# Patient Record
Sex: Female | Born: 1940
Health system: Southern US, Community
[De-identification: ages and names within clinical notes are randomized; demographics above are authoritative.]

## PROBLEM LIST (undated history)

## (undated) DIAGNOSIS — F419 Anxiety disorder, unspecified: Secondary | ICD-10-CM

## (undated) DIAGNOSIS — IMO0002 Reserved for concepts with insufficient information to code with codable children: Secondary | ICD-10-CM

## (undated) DIAGNOSIS — C801 Malignant (primary) neoplasm, unspecified: Secondary | ICD-10-CM

## (undated) DIAGNOSIS — K6389 Other specified diseases of intestine: Secondary | ICD-10-CM

## (undated) DIAGNOSIS — E663 Overweight: Secondary | ICD-10-CM

## (undated) DIAGNOSIS — E785 Hyperlipidemia, unspecified: Secondary | ICD-10-CM

## (undated) DIAGNOSIS — I10 Essential (primary) hypertension: Secondary | ICD-10-CM

## (undated) HISTORY — PX: COLECTOMY: SHX59

## (undated) HISTORY — DX: Malignant (primary) neoplasm, unspecified: C80.1

## (undated) HISTORY — DX: Overweight: E66.3

## (undated) HISTORY — DX: Reserved for concepts with insufficient information to code with codable children: IMO0002

## (undated) HISTORY — DX: Other specified diseases of intestine: K63.89

## (undated) HISTORY — DX: Essential (primary) hypertension: I10

---

## 1998-05-26 ENCOUNTER — Other Ambulatory Visit: Admission: RE | Admit: 1998-05-26 | Discharge: 1998-05-26 | Payer: Self-pay | Admitting: Obstetrics and Gynecology

## 1999-08-10 ENCOUNTER — Other Ambulatory Visit: Admission: RE | Admit: 1999-08-10 | Discharge: 1999-08-10 | Payer: Self-pay | Admitting: Obstetrics and Gynecology

## 2000-08-28 ENCOUNTER — Other Ambulatory Visit: Admission: RE | Admit: 2000-08-28 | Discharge: 2000-08-28 | Payer: Self-pay | Admitting: Obstetrics and Gynecology

## 2001-09-11 ENCOUNTER — Other Ambulatory Visit: Admission: RE | Admit: 2001-09-11 | Discharge: 2001-09-11 | Payer: Self-pay | Admitting: Obstetrics and Gynecology

## 2006-03-09 ENCOUNTER — Other Ambulatory Visit: Admission: RE | Admit: 2006-03-09 | Discharge: 2006-03-09 | Payer: Self-pay | Admitting: Family Medicine

## 2007-03-22 ENCOUNTER — Inpatient Hospital Stay (HOSPITAL_COMMUNITY): Admission: RE | Admit: 2007-03-22 | Discharge: 2007-03-27 | Payer: Self-pay | Admitting: General Surgery

## 2007-03-22 ENCOUNTER — Encounter (HOSPITAL_BASED_OUTPATIENT_CLINIC_OR_DEPARTMENT_OTHER): Payer: Self-pay | Admitting: General Surgery

## 2008-01-22 IMAGING — CR DG CHEST 2V
2 series · 2 of 2 positions shown · non-contrast
Comparison: none

CLINICAL DATA: Colon carcinoma.  Preop respiratory exam.  
 CHEST ? 2 VIEW:

[view not recorded (1 of 2)]
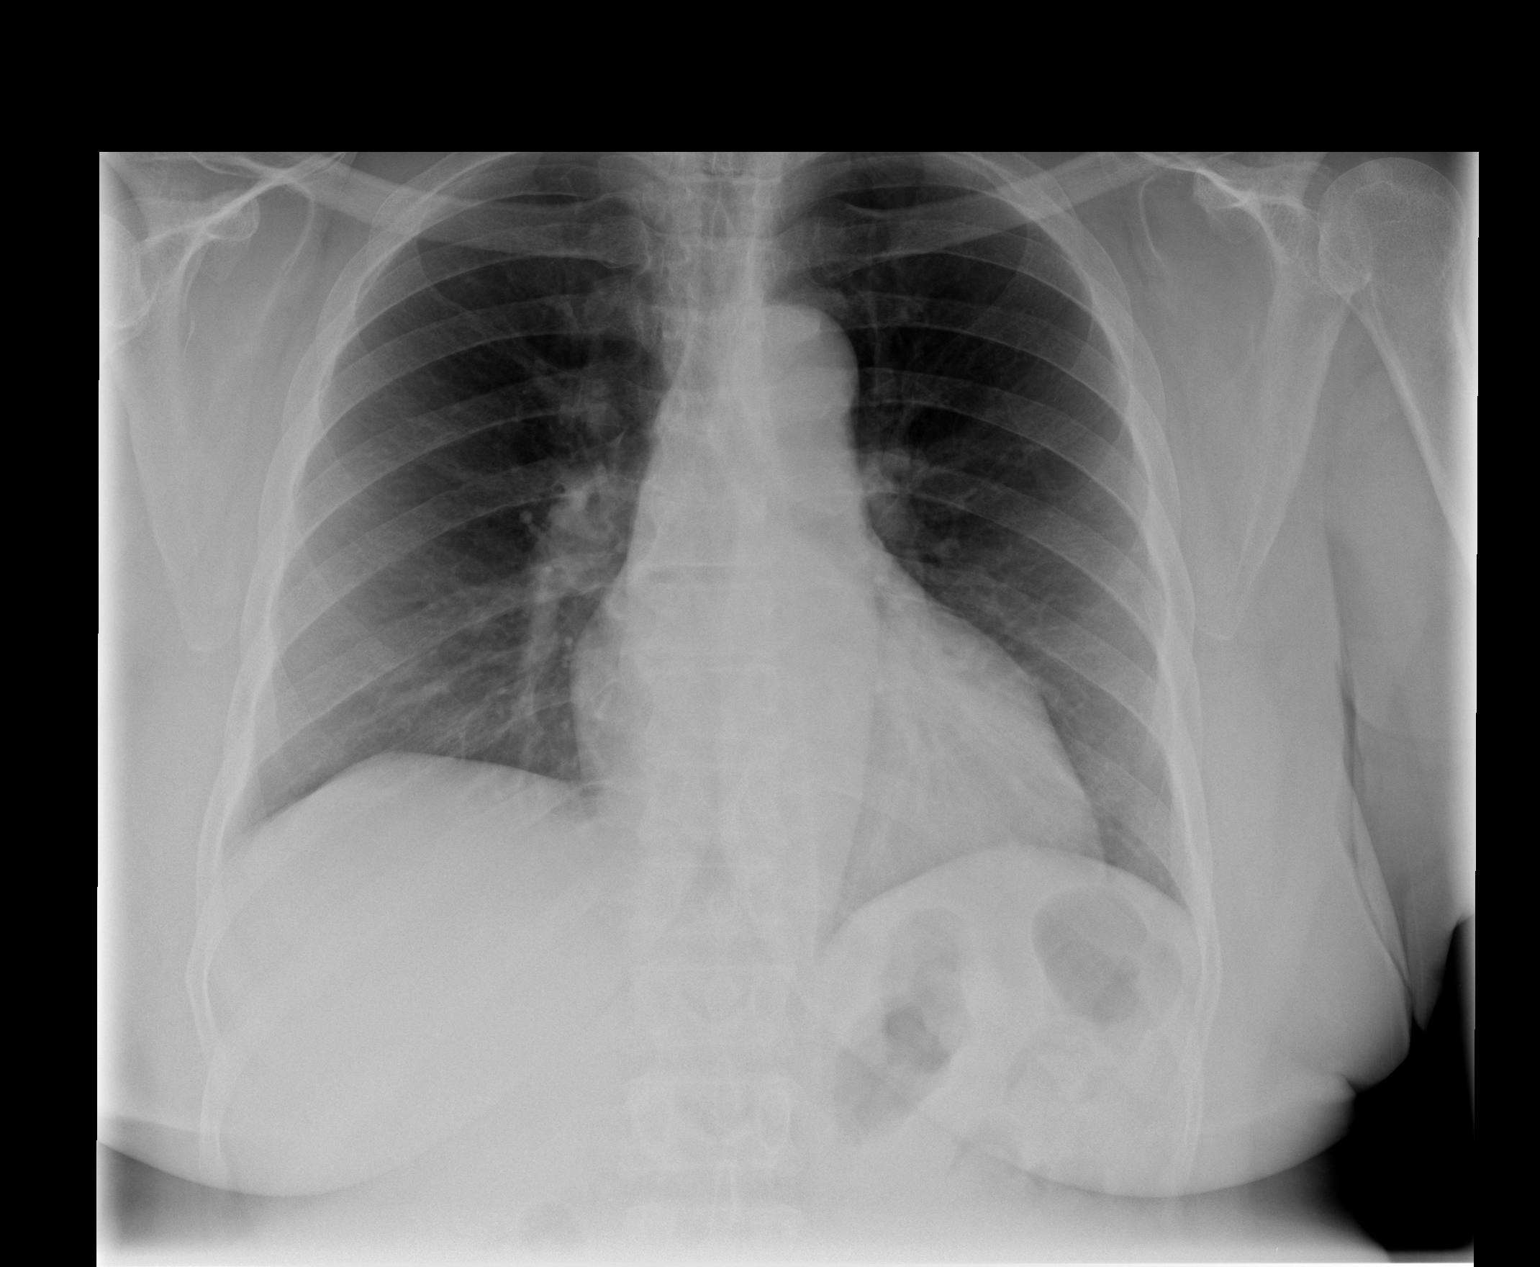

[view not recorded (2 of 2)]
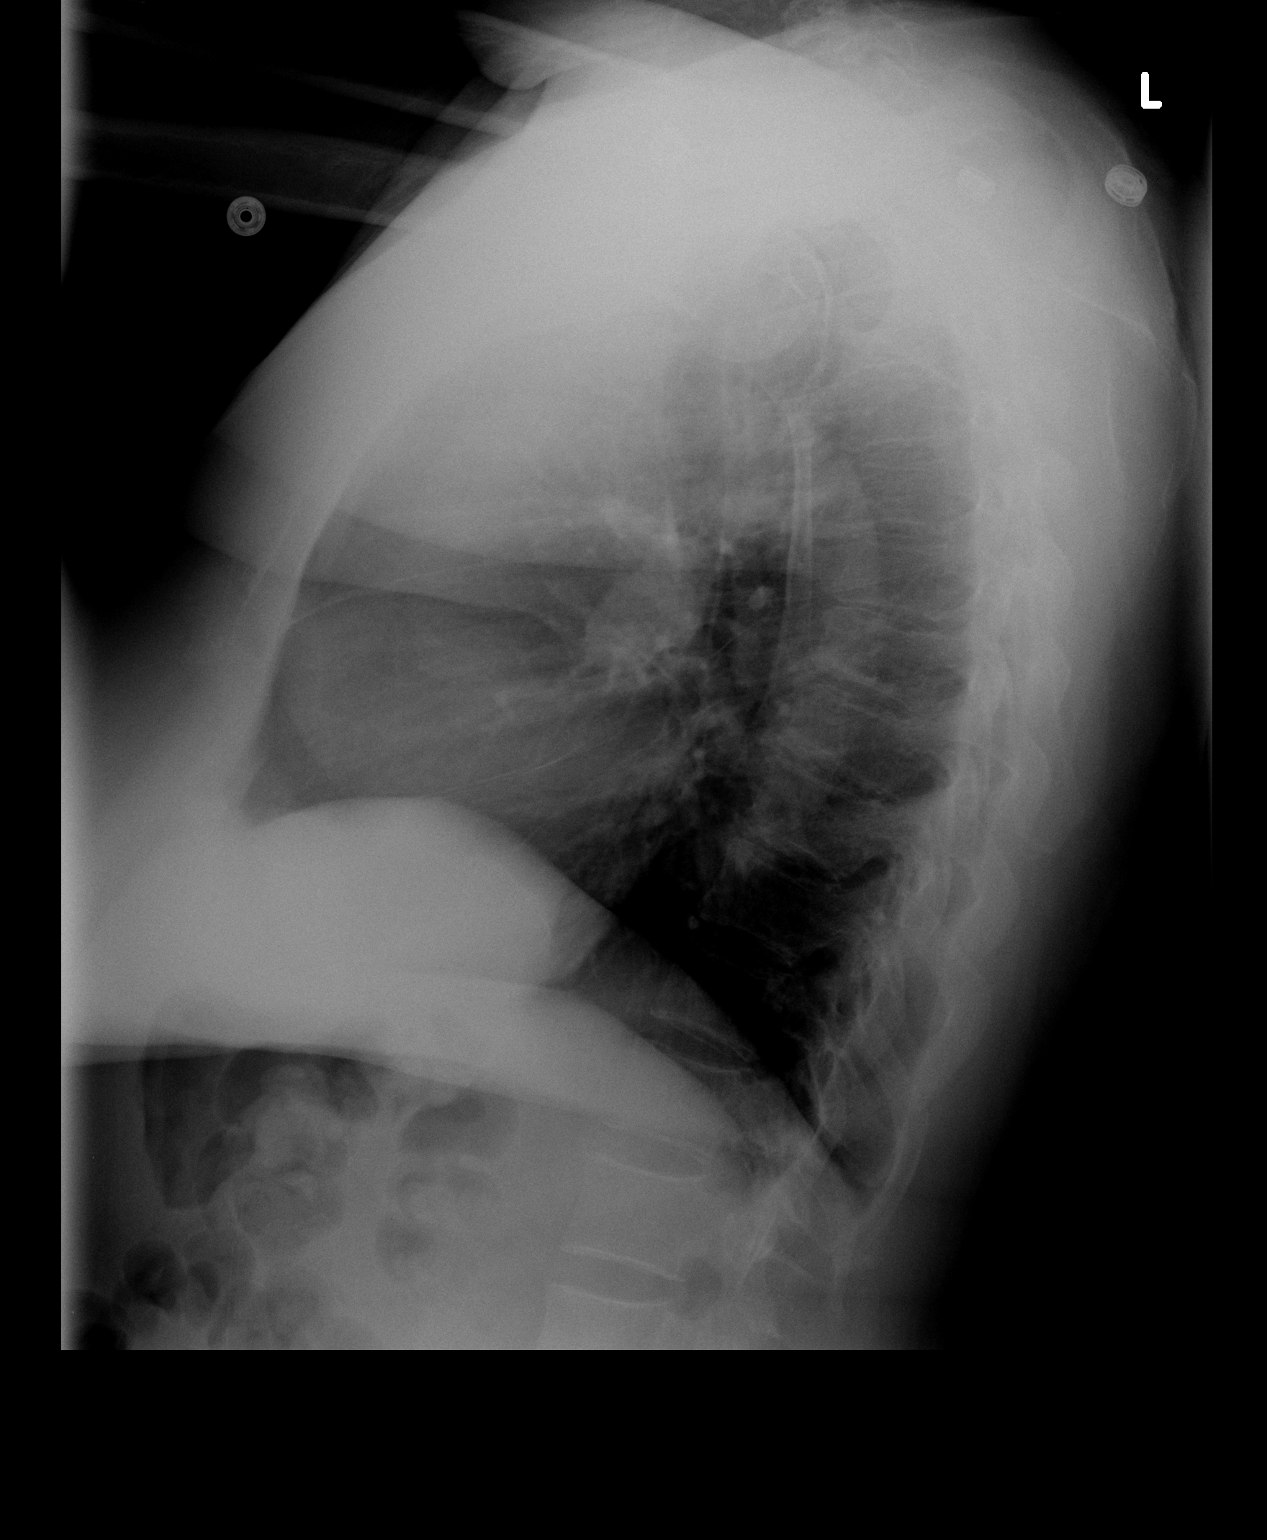

[2 of 2 positions shown; findings below may reference images not displayed]

FINDINGS: The heart size and mediastinal contours are within normal limits.  Both lungs are clear.  The visualized skeletal structures are unremarkable.
IMPRESSION: No active cardiopulmonary disease.

## 2010-11-23 NOTE — Op Note (Signed)
Monique Snyder, Monique Snyder           ACCOUNT NO.:  [id number]   MEDICAL RECORD NO.:  [id number]          PATIENT TYPE:  INP   LOCATION:  5742                         FACILITY:  [facility]   PHYSICIAN:  Leonie Man, M.D.   DATE OF BIRTH:  30-Apr-1941   DATE OF PROCEDURE:  03/22/2007  DATE OF DISCHARGE:                               OPERATIVE REPORT   PREOPERATIVE DIAGNOSIS:  Carcinoma of the colon (cecum).   PROCEDURE:  Right hemicolectomy.   SURGEON:  Leonie Man, MD   ASSISTANT:  Lennie Muckle, MD   ANESTHESIA:  General.   SPECIMENS TO LAB:  Right colon and portion of distal ileum.   SURGICAL FINDINGS:  A large umbilicating tumor of the cecum.  No  palpable hepatic metastases.  No sidewall seeding.  The mesentery did  not appear to have any unusually large lymph nodes.   INDICATIONS:  [name] is a 70 year old female presenting  originally with some mild amounts of rectal bleeding and noted on the  blood evaluations to be mildly anemic.  She was referred to Dr. Charna Elizabeth for evaluation of her colon and on colonoscopy was noted to have a  large tumor, which on biopsy shows an invasive carcinoma.  The patient  comes to the operating room now after undergoing  a routine bowel prep  for right hemicolectomy.  She understands the risks and potential  benefits of surgery and gives her consent to same.   PROCEDURE:  Following the induction of satisfactory general anesthesia  with the patient positioned supinely, the abdomen is prepped and draped  to be included in the sterile operative field.  I made a transverse  right flank incision, deepened this through the skin and subcutaneous  tissues down to the rectus muscle.  The rectus muscle was divided and  the posterior rectus sheath and peritoneum grasped and opened.  Exploration of the abdomen was carried out with the findings as noted  above.  The right hemicolectomy was started by dissecting along the  right paracolic  gutter, protecting the gonadal vessels and the ureter on  the right side throughout the course of the dissection.  Dissection was  carried up along the right paracolic gutter.  The duodenum was clearly  seen and protected.  The hepatic flexure was taken down using a LigaSure  and the gastrocolic omentum taken down up to the region of the middle  colic vessels.  The bowel was divided at the distal ileum and the mid  transverse colon.  The intervening mesentery was taken with the  LigaSure.  The right colic vessels were doubly ligated both with the  LigaSure and with 2-0 silk suture.  The specimen was then removed and  forwarded for pathologic evaluation.  All areas of dissection were  checked for hemostasis and noted be dry.  A functional end-to-end  anastomosis was carried out with a GIA stapler and TA-55 stapling  device.  The resulting anastomosis was noted to be quite patent and  without any evidence of a leak.  The mesenteric defect was closed with  interrupted 3-0 silk sutures.  The entire  abdomen was then again checked  for hemostasis and all areas of the dissection noted be dry.  Sponge and  instrument counts were verified.  The peritoneal cavity was irrigated  with multiple aliquots of saline and aspirated.  The abdominal wound was  closed then in two layers with a running #1 PDS suture in the posterior  rectus sheath and peritoneum and again a #1 PDS suture closing the  midline and the anterior rectus fascia.  All areas were irrigated and  the skin was then  closed with stainless steel staples.  Sterile dressings were applied.  Final sponge count was verified and a sterile dressing was applied to  the wound, the anesthetic reversed and the patient removed from the  operating room to the recovery room in stable condition.  She tolerated  the procedure well.      Leonie Man, M.D.  Electronically Signed     PB/MEDQ  D:  03/22/2007  T:  03/22/2007  Job:  [id number]   cc:    Sharlet Salina [address]. [address], [place]  Dario Guardian, M.D.

## 2010-11-26 NOTE — Discharge Summary (Signed)
NAMESHAILY, Monique Snyder           ACCOUNT NO.:  [id number]   MEDICAL RECORD NO.:  [id number]          PATIENT TYPE:  INP   LOCATION:  5742                         FACILITY:  [facility]   PHYSICIAN:  Leonie Man, M.D.   DATE OF BIRTH:  1940-08-21   DATE OF ADMISSION:  03/22/2007  DATE OF DISCHARGE:  03/27/2007                               DISCHARGE SUMMARY   ADMISSION DIAGNOSIS:  Carcinoma of the colon and cecum.   DISCHARGE DIAGNOSIS:  Carcinoma of the colon and cecum.   PROCEDURE:  In hospital is right hemicolectomy with primary anastomosis.   COMPLICATIONS:  None.   CONDITION ON DISCHARGE:  Improved.   PATHOLOGICAL FINDINGS:  Large umbilicating tumor of the cecum with tumor  invading the subserosal soft tissue but no definite visceral peritoneal  involvement, chronic sinusitis with fibrovascular adhesions.  Mucosal  margins are free of tumor.  There are 15 lymph nodes removed that were  negative for tumor.  Tumor size is 7 cm.  Tumor grade is G3, poorly  differentiated.  Final TNM code:  T3 N0 M0.   Monique Snyder is a 70 year old lady presenting originally with mild  rectal bleeding.  She subsequently underwent colonoscopy and biopsy  which showed invasive carcinoma.  She was prepped and taken to the  operating room where she underwent a right-sided hemicolectomy.  Her  postoperative course has been essentially benign with normal resumption  of diet and activity.  At the time of discharge, her wound is healing  satisfactorily.  She is tolerating regular diet and having bowel  movements.  She is being discharged to be followed in the office in 2  weeks.   DISCHARGE MEDICATIONS:  Vicodin 1 to 2 every 4 hours p.r.n. for pain.      Leonie Man, M.D.  Electronically Signed     PB/MEDQ  D:  06/06/2007  T:  06/06/2007  Job:  [id number]

## 2010-11-26 NOTE — Cardiovascular Report (Signed)
[place]  Patient:    Monique Snyder, Monique Snyder Visit Number: [id number] MRN: [id number]          Service Type: Attending:  Cecil Cranker, M.D. [facility]   CC:         Lilyan Punt, M.D.  Luis Abed, M.D. [facility]  [facility] in [place]   Cardiac Catheterization  PROCEDURE:  Selective coronary angiography, left ventricular angiography, abdominal aortography - Judkins technique.  INDICATIONS:  The patient is a 70 year old obese black female with risk factors, recent dyspnea on exertion, and chest pain.  RESULTS:  PRESSURES:  LV systolic 165, diastolic 22.  AO systolic 165, diastolic 79.  ANGIOGRAPHY:  The left main coronary artery is normal.  The left anterior descending artery is normal.  The circumflex coronary artery is normal.  The right coronary artery is normal.  The left ventricle is hyperdynamic.  The abdominal aortogram is normal and renal arteries are normal.  SUMMARY: 1. Normal selective coronary angiography. 2. Hyperdynamic left ventricular contractility. 3. Normal abdominal aortogram.  The patient will be treated with medical therapy.  She may have on the echo showed some thickening of the septum and this may be a form of nonobstructive hypertrophic cardiomyopathy. Attending:  Cecil Cranker, M.D. [facility] DD:  03/21/01 TD:  03/21/01 Job: [id number]

## 2011-04-22 LAB — COMPREHENSIVE METABOLIC PANEL
ALT: 25
AST: 37
Albumin: 2.9 — ABNORMAL LOW
Albumin: 2.9 — ABNORMAL LOW
Alkaline Phosphatase: 63
BUN: 3 — ABNORMAL LOW
CO2: 27
Calcium: 8.4
Calcium: 8.7
Chloride: 105
Creatinine, Ser: 0.84
Creatinine, Ser: 0.85
GFR calc Af Amer: >60
Glucose, Bld: 113 — ABNORMAL HIGH
Sodium: 136
Total Protein: 6.1

## 2011-04-22 LAB — CBC
HCT: 27.3 — ABNORMAL LOW
HCT: 27.6 — ABNORMAL LOW
Hemoglobin: 8.5 — ABNORMAL LOW
Hemoglobin: 8.8 — ABNORMAL LOW
MCHC: 31.3
MCHC: 31.6
MCHC: 31.8
MCV: 80.5
MCV: 80.7
Platelets: 513 — ABNORMAL HIGH
Platelets: 533 — ABNORMAL HIGH
Platelets: 556 — ABNORMAL HIGH
RBC: 3.36 — ABNORMAL LOW
RBC: 3.51 — ABNORMAL LOW
RBC: 3.64 — ABNORMAL LOW
RDW: 18.6 — ABNORMAL HIGH
WBC: 13.1 — ABNORMAL HIGH
WBC: 7.6

## 2011-04-22 LAB — BASIC METABOLIC PANEL
BUN: 2 — ABNORMAL LOW
CO2: 30
CO2: 32
CO2: 33 — ABNORMAL HIGH
Calcium: 8.6
Calcium: 9
Chloride: 102
Chloride: 105
GFR calc Af Amer: >60
Glucose, Bld: 88
Glucose, Bld: 98
Potassium: 3.1 — ABNORMAL LOW
Potassium: 3.9
Sodium: 141
Sodium: 142
Sodium: 142

## 2011-04-22 LAB — CROSSMATCH
ABO/RH(D): A POS
Antibody Screen: NEGATIVE

## 2013-09-28 ENCOUNTER — Encounter: Payer: Self-pay | Admitting: *Deleted

## 2013-09-28 DIAGNOSIS — I10 Essential (primary) hypertension: Secondary | ICD-10-CM | POA: Insufficient documentation

## 2018-12-19 ENCOUNTER — Other Ambulatory Visit: Payer: Self-pay | Admitting: *Deleted

## 2018-12-19 DIAGNOSIS — Z20822 Contact with and (suspected) exposure to covid-19: Secondary | ICD-10-CM

## 2018-12-19 NOTE — Progress Notes (Signed)
[id number]

## 2018-12-20 LAB — NOVEL CORONAVIRUS, NAA: SARS-CoV-2, NAA: NOT DETECTED

## 2019-04-09 ENCOUNTER — Encounter: Payer: Self-pay | Admitting: Internal Medicine

## 2019-06-04 ENCOUNTER — Other Ambulatory Visit: Payer: Self-pay

## 2019-06-04 DIAGNOSIS — Z20822 Contact with and (suspected) exposure to covid-19: Secondary | ICD-10-CM

## 2019-06-06 LAB — NOVEL CORONAVIRUS, NAA: SARS-CoV-2, NAA: NOT DETECTED

## 2019-06-10 ENCOUNTER — Telehealth: Payer: Self-pay | Admitting: General Practice

## 2019-06-10 NOTE — Telephone Encounter (Signed)
Pt called for Covid results.  Pt was told that Covid was Not Detected

## 2019-08-20 DIAGNOSIS — E669 Obesity, unspecified: Secondary | ICD-10-CM | POA: Diagnosis not present

## 2019-08-20 DIAGNOSIS — E785 Hyperlipidemia, unspecified: Secondary | ICD-10-CM | POA: Diagnosis not present

## 2019-08-20 DIAGNOSIS — Z0000 Encounter for general adult medical examination without abnormal findings: Secondary | ICD-10-CM | POA: Diagnosis not present

## 2019-08-20 DIAGNOSIS — F419 Anxiety disorder, unspecified: Secondary | ICD-10-CM | POA: Diagnosis not present

## 2019-08-20 DIAGNOSIS — I10 Essential (primary) hypertension: Secondary | ICD-10-CM | POA: Diagnosis not present

## 2019-08-20 DIAGNOSIS — Z1389 Encounter for screening for other disorder: Secondary | ICD-10-CM | POA: Diagnosis not present

## 2019-09-19 DIAGNOSIS — Z1231 Encounter for screening mammogram for malignant neoplasm of breast: Secondary | ICD-10-CM | POA: Diagnosis not present

## 2019-09-26 DIAGNOSIS — L821 Other seborrheic keratosis: Secondary | ICD-10-CM | POA: Diagnosis not present

## 2019-09-26 DIAGNOSIS — L720 Epidermal cyst: Secondary | ICD-10-CM | POA: Diagnosis not present

## 2019-09-26 DIAGNOSIS — D229 Melanocytic nevi, unspecified: Secondary | ICD-10-CM | POA: Diagnosis not present

## 2019-10-21 DIAGNOSIS — Z03818 Encounter for observation for suspected exposure to other biological agents ruled out: Secondary | ICD-10-CM | POA: Diagnosis not present

## 2019-10-24 ENCOUNTER — Ambulatory Visit: Payer: Self-pay

## 2019-11-01 ENCOUNTER — Ambulatory Visit: Payer: Medicare HMO | Attending: Internal Medicine

## 2019-11-01 DIAGNOSIS — Z23 Encounter for immunization: Secondary | ICD-10-CM

## 2019-11-01 NOTE — Progress Notes (Signed)
   Covid-19 Vaccination Clinic  Name:  Monique Snyder    MRN: [id number] DOB: [birth year]  11/01/2019  [name] was observed post Covid-19 immunization for 15 minutes without incident. She was provided with Vaccine Information Sheet and instruction to access the V-Safe system.   [name] was instructed to call 911 with any severe reactions post vaccine: Marland Kitchen Difficulty breathing  . Swelling of face and throat  . A fast heartbeat  . A bad rash all over body  . Dizziness and weakness   Immunizations Administered    Name Date Dose VIS Date Route   Pfizer COVID-19 Vaccine 11/01/2019 10:07 AM 0.3 mL 09/04/2018 Intramuscular   Manufacturer: [place]   Lot: [id number]   [place]: [id number]

## 2019-11-25 ENCOUNTER — Ambulatory Visit: Payer: Medicare HMO | Attending: Internal Medicine

## 2019-11-25 DIAGNOSIS — Z23 Encounter for immunization: Secondary | ICD-10-CM

## 2019-11-25 NOTE — Progress Notes (Signed)
   Covid-19 Vaccination Clinic  Name:  Monique Snyder    MRN: [id number] DOB: 1940-09-06  11/25/2019  [name] was observed post Covid-19 immunization for 15 minutes without incident. She was provided with Vaccine Information Sheet and instruction to access the V-Safe system.   [name] was instructed to call 911 with any severe reactions post vaccine: Marland Kitchen Difficulty breathing  . Swelling of face and throat  . A fast heartbeat  . A bad rash all over body  . Dizziness and weakness   Immunizations Administered    Name Date Dose VIS Date Route   Pfizer COVID-19 Vaccine 11/25/2019 10:04 AM 0.3 mL 09/04/2018 Intramuscular   Manufacturer: [place]   Lot: [id number]   [place]: [id number]

## 2020-06-09 DIAGNOSIS — F439 Reaction to severe stress, unspecified: Secondary | ICD-10-CM | POA: Diagnosis not present

## 2020-06-09 DIAGNOSIS — R001 Bradycardia, unspecified: Secondary | ICD-10-CM | POA: Diagnosis not present

## 2020-06-09 DIAGNOSIS — I10 Essential (primary) hypertension: Secondary | ICD-10-CM | POA: Diagnosis not present

## 2020-06-16 ENCOUNTER — Ambulatory Visit: Payer: Medicare HMO | Attending: Internal Medicine

## 2020-06-16 DIAGNOSIS — Z23 Encounter for immunization: Secondary | ICD-10-CM

## 2020-06-16 NOTE — Progress Notes (Signed)
   Covid-19 Vaccination Clinic  Name:  [name]    MRN: [id number] DOB: 05/19/1941  06/16/2020  Monique Snyder was observed post Covid-19 immunization for 15 minutes without incident. She was provided with Vaccine Information Sheet and instruction to access the V-Safe system.   Monique Snyder was instructed to call 911 with any severe reactions post vaccine: Marland Kitchen Difficulty breathing  . Swelling of face and throat  . A fast heartbeat  . A bad rash all over body  . Dizziness and weakness   Immunizations Administered    No immunizations on file.

## 2020-06-17 NOTE — Progress Notes (Signed)
Cardiology Office Note:   Date:  06/19/2020  NAME:  Monique Snyder    MRN: [id number] DOB:  [birth year]   PCP:  Carol Ada, MD  Cardiologist:  No primary care provider on file.  Electrophysiologist:  None   Referring MD: Carol Ada, MD   Chief Complaint  Patient presents with  . New Patient (Initial Visit)       . Bradycardia   History of Present Illness:   Monique Snyder is a 79 y.o. female with a hx of hypertension who is being seen today for the evaluation of bradycardia at the request of Carol Ada, MD.  She reports she had an EKG done by her primary care physician at her request.  She had no symptoms of chest pain or shortness of breath.  She is wanting to have one done.  She was noted to have sinus bradycardia at that visit.  EKG also showed LVH with repolarization abnormality.  She was then sent for evaluation.  Her EKG today demonstrates sinus bradycardia, heart rate 60.  She does have LVH with repolarization abnormality.  She also has anterior Q waves which are likely related to LVH.  She denies any symptoms.  She reports she exercises 3 days/week.  She walks up to 2 miles without any chest pain or shortness of breath.  She describes no dizziness or lightheadedness.  She has no worsening lower extremity edema.  She is also reported to be helping her daughter move.  She is lifting heavy boxes without any limitations.  Her medical history is significant for colon cancer status post colectomy.  She is in remission.  She also suffers from high blood pressure.  She takes several medications and blood pressure is well controlled today in office.  She is never had an echocardiogram.  She is never smoker.  She does not drink alcohol or use drugs.  Heart disease is reported in her father and brother.  She is never had a heart attack or stroke.  She overall appears to be well without any complaints.  Needs an updated TSH.  Cholesterol level a bit elevated but for age and given  lack of any cardiovascular disease likely okay.  Past Medical History: Past Medical History:  Diagnosis Date  . Adenocarcinoma ([place])   . Cecum mass   . Herniated disc   . Hypertension   . Overweight(278.02)     Past Surgical History: Past Surgical History:  Procedure Laterality Date  . COLECTOMY      Current Medications: Current Meds  Medication Sig  . ALPRAZolam (XANAX) 0.5 MG tablet Take 0.5 mg by mouth at bedtime as needed for anxiety.  Marland Kitchen amLODipine (NORVASC) 10 MG tablet Take 10 mg by mouth daily.  Marland Kitchen olmesartan-hydrochlorothiazide (BENICAR HCT) 40-12.5 MG per tablet Take 1 tablet by mouth daily.     Allergies:    Penicillins   Social History: Social History   Socioeconomic History  . Marital status: Widowed    Spouse name: Not on file  . Number of children: Not on file  . Years of education: Not on file  . Highest education level: Not on file  Occupational History  . Occupation: retired  Tobacco Use  . Smoking status: Never Smoker  . Smokeless tobacco: Never Used  Substance and Sexual Activity  . Alcohol use: No  . Drug use: No  . Sexual activity: Not on file  Other Topics Concern  . Not on file  Social History Narrative  . Not on  file   Social Determinants of Health   Financial Resource Strain: Not on file  Food Insecurity: Not on file  Transportation Needs: Not on file  Physical Activity: Not on file  Stress: Not on file  Social Connections: Not on file     Family History: The patient's family history includes Heart disease in her brother and father.  ROS:   All other ROS reviewed and negative. Pertinent positives noted in the HPI.     EKGs/Labs/Other Studies Reviewed:   The following studies were personally reviewed by me today:  EKG:  EKG is ordered today.  The ekg ordered today demonstrates sinus bradycardia, heart rate 60, LVH noted with repolarization normalities, T wave inversions in the lateral leads, and was personally reviewed by  me.   Recent Labs: No results found for requested labs within last 8760 hours.   Recent Lipid Panel No results found for: CHOL, TRIG, HDL, CHOLHDL, VLDL, LDLCALC, LDLDIRECT  Physical Exam:   VS:  BP 114/76 (BP Location: Left Arm, Patient Position: Sitting, Cuff Size: Normal)   Pulse 60   Ht 5\' 2"  (1.575 m)   Wt 174 lb (78.9 kg)   BMI 31.83 kg/m    Wt Readings from Last 3 Encounters:  06/19/20 174 lb (78.9 kg)    General: Well nourished, well developed, in no acute distress Head: Atraumatic, normal size  Eyes: PEERLA, EOMI  Neck: Supple, no JVD Endocrine: No thryomegaly Cardiac: Normal S1, S2; RRR; no murmurs, rubs, or gallops Lungs: Clear to auscultation bilaterally, no wheezing, rhonchi or rales  Abd: Soft, nontender, no hepatomegaly  Ext: No edema, pulses 2+ Musculoskeletal: No deformities, BUE and BLE strength normal and equal Skin: Warm and dry, no rashes   Neuro: Alert and oriented to person, place, time, and situation, CNII-XII grossly intact, no focal deficits  Psych: Normal mood and affect   ASSESSMENT:   Monique Snyder is a 79 y.o. female who presents for the following: 1. Bradycardia   2. Nonspecific abnormal electrocardiogram (ECG) (EKG)   3. LVH (left ventricular hypertrophy)   4. Primary hypertension     PLAN:   1. Bradycardia 2. Nonspecific abnormal electrocardiogram (ECG) (EKG) 3. LVH (left ventricular hypertrophy) -EKG demonstrates sinus bradycardia.  She has no symptoms from this.  She is not on any medications that we will do this.  She does need a TSH to make sure that is not an issue.  She has no symptoms from bradycardia.  She can walk 2 miles without any limitations such as chest pain, shortness of breath or dizziness.  She overall is in good health. -EKG also demonstrates LVH changes.  I would like for her to obtain an echocardiogram.  I suspect these changes are just related to blood pressure.  We need to make sure there is no other issues such  as a cardiomyopathy to explain this.  We will obtain an echocardiogram and I will communicate the results to her by phone.  If I need to see her back in person I will.  4. Primary hypertension -Well-controlled.  No change in medications.  Disposition: Return if symptoms worsen or fail to improve.  Medication Adjustments/Labs and Tests Ordered: Current medicines are reviewed at length with the patient today.  Concerns regarding medicines are outlined above.  Orders Placed This Encounter  Procedures  . TSH  . EKG 12-Lead  . ECHOCARDIOGRAM COMPLETE   No orders of the defined types were placed in this encounter.   Patient Instructions  Medication Instructions:  No changes *If you need a refill on your cardiac medications before your next appointment, please call your pharmacy*   Lab Work: TSH If you have labs (blood work) drawn today and your tests are completely normal, you will receive your results only by: Marland Kitchen MyChart Message (if you have MyChart) OR . A paper copy in the mail If you have any lab test that is abnormal or we need to change your treatment, we will call you to review the results.   Testing/Procedures: Your physician has requested that you have an echocardiogram. Echocardiography is a painless test that uses sound waves to create images of your heart. It provides your doctor with information about the size and shape of your heart and how well your heart's chambers and valves are working. This procedure takes approximately one hour. There are no restrictions for this procedure. This test is performed at [address].     Follow-Up: At [facility], you and your health needs are our priority.  As part of our continuing mission to provide you with exceptional heart care, we have created designated Provider Care Teams.  These Care Teams include your primary Cardiologist (physician) and Advanced Practice Providers (APPs -  Physician Assistants and Nurse  Practitioners) who all work together to provide you with the care you need, when you need it.  We recommend signing up for the patient portal called "MyChart".  Sign up information is provided on this After Visit Summary.  MyChart is used to connect with patients for Virtual Visits (Telemedicine).  Patients are able to view lab/test results, encounter notes, upcoming appointments, etc.  Non-urgent messages can be sent to your provider as well.   To learn more about what you can do with MyChart, go to [email or web address].    Your next appointment:   Follow up with Dr. Audie Box on an as-needed basis.   Other Instructions None     Signed, [place] T. Audie Box, [place]  [address], [place], [place] [address] [phone]  06/19/2020 5:05 PM

## 2020-06-19 ENCOUNTER — Other Ambulatory Visit: Payer: Self-pay

## 2020-06-19 ENCOUNTER — Ambulatory Visit: Payer: Medicare HMO | Admitting: Cardiovascular Disease

## 2020-06-19 ENCOUNTER — Encounter: Payer: Self-pay | Admitting: Cardiovascular Disease

## 2020-06-19 VITALS — BP 114/76 | HR 60 | Ht 62.0 in | Wt 174.0 lb

## 2020-06-19 DIAGNOSIS — I517 Cardiomegaly: Secondary | ICD-10-CM | POA: Diagnosis not present

## 2020-06-19 DIAGNOSIS — R9431 Abnormal electrocardiogram [ECG] [EKG]: Secondary | ICD-10-CM | POA: Diagnosis not present

## 2020-06-19 DIAGNOSIS — R001 Bradycardia, unspecified: Secondary | ICD-10-CM

## 2020-06-19 DIAGNOSIS — I10 Essential (primary) hypertension: Secondary | ICD-10-CM | POA: Diagnosis not present

## 2020-06-19 DIAGNOSIS — Z1211 Encounter for screening for malignant neoplasm of colon: Secondary | ICD-10-CM | POA: Diagnosis not present

## 2020-06-19 NOTE — Patient Instructions (Signed)
Medication Instructions:  No changes *If you need a refill on your cardiac medications before your next appointment, please call your pharmacy*   Lab Work: TSH If you have labs (blood work) drawn today and your tests are completely normal, you will receive your results only by: Marland Kitchen MyChart Message (if you have MyChart) OR . A paper copy in the mail If you have any lab test that is abnormal or we need to change your treatment, we will call you to review the results.   Testing/Procedures: Your physician has requested that you have an echocardiogram. Echocardiography is a painless test that uses sound waves to create images of your heart. It provides your doctor with information about the size and shape of your heart and how well your heart's chambers and valves are working. This procedure takes approximately one hour. There are no restrictions for this procedure. This test is performed at [address].     Follow-Up: At [facility], you and your health needs are our priority.  As part of our continuing mission to provide you with exceptional heart care, we have created designated Provider Care Teams.  These Care Teams include your primary Cardiologist (physician) and Advanced Practice Providers (APPs -  Physician Assistants and Nurse Practitioners) who all work together to provide you with the care you need, when you need it.  We recommend signing up for the patient portal called "MyChart".  Sign up information is provided on this After Visit Summary.  MyChart is used to connect with patients for Virtual Visits (Telemedicine).  Patients are able to view lab/test results, encounter notes, upcoming appointments, etc.  Non-urgent messages can be sent to your provider as well.   To learn more about what you can do with MyChart, go to [email or web address].    Your next appointment:   Follow up with Dr. Audie Box on an as-needed basis.   Other Instructions None

## 2020-06-20 LAB — TSH: TSH: 1.74 u[IU]/mL (ref 0.450–4.500)

## 2020-06-23 ENCOUNTER — Encounter: Payer: Self-pay | Admitting: *Deleted

## 2020-06-29 ENCOUNTER — Telehealth: Payer: Self-pay | Admitting: Cardiovascular Disease

## 2020-06-29 NOTE — Telephone Encounter (Signed)
Monique Snyder is returning Monique Snyder's call. She is also requesting her EKG results when calling back. She states if she is unable to answer please leave a detailed message and she will callback with questions if necessary. Please advise.

## 2020-06-29 NOTE — Telephone Encounter (Signed)
Noted. Thanks.

## 2020-06-29 NOTE — Telephone Encounter (Signed)
Attempted to call patient, left message for patient to call back to office.   

## 2020-06-29 NOTE — Telephone Encounter (Signed)
Per Dr. Audie Box, EKG showed changes possibly consistent with hypertension. Dr. Audie Box would like for patient to continue with plan for Echocardiogram.   Attempted to call patient, Left message with above comments and advised patient to keep appointment for Echocardiogram if that is still okay with patient. Advised patient on voicemail to return call to office if any questions or concerns.

## 2020-06-29 NOTE — Telephone Encounter (Signed)
Patient got the report from her visit 06/19/20. She wanted to know if it was still necessary for her to keep her appointment for her Echo 07/17/20.  If the patient is not home, please leave a detailed message on her home answering machine

## 2020-07-17 ENCOUNTER — Other Ambulatory Visit: Payer: Self-pay

## 2020-07-17 ENCOUNTER — Ambulatory Visit (HOSPITAL_COMMUNITY): Payer: Medicare PPO | Attending: Cardiovascular Disease

## 2020-07-17 DIAGNOSIS — R9431 Abnormal electrocardiogram [ECG] [EKG]: Secondary | ICD-10-CM

## 2020-07-17 LAB — ECHOCARDIOGRAM COMPLETE
Area-P 1/2: 3.31 cm2
S' Lateral: 2.8 cm

## 2020-07-21 ENCOUNTER — Telehealth: Payer: Self-pay | Admitting: Cardiovascular Disease

## 2020-07-21 NOTE — Telephone Encounter (Signed)
Patient calling with questions about her echo results.

## 2020-07-21 NOTE — Telephone Encounter (Signed)
Spoke with patient to review echo results. Patient would like to speak to Dr. Kathalene Frames nurse Almyra Free to further discuss.

## 2020-07-23 NOTE — Telephone Encounter (Signed)
Attempt to call patient, line busy.

## 2020-07-23 NOTE — Telephone Encounter (Signed)
Can you please call this patient for me today- offer a virtual visit to discuss questions per Dr.O'Neal he would like to just discuss and ask her. He states tomorrow or next week is fine.  Thank you!

## 2020-08-06 NOTE — Telephone Encounter (Signed)
Called patient, LVM to call back to have questions answered, and offer possible appointment.  Left call back number.

## 2020-09-22 DIAGNOSIS — Z1231 Encounter for screening mammogram for malignant neoplasm of breast: Secondary | ICD-10-CM | POA: Diagnosis not present

## 2020-12-21 DIAGNOSIS — Z1389 Encounter for screening for other disorder: Secondary | ICD-10-CM | POA: Diagnosis not present

## 2020-12-21 DIAGNOSIS — F419 Anxiety disorder, unspecified: Secondary | ICD-10-CM | POA: Diagnosis not present

## 2020-12-21 DIAGNOSIS — Z0000 Encounter for general adult medical examination without abnormal findings: Secondary | ICD-10-CM | POA: Diagnosis not present

## 2020-12-21 DIAGNOSIS — E785 Hyperlipidemia, unspecified: Secondary | ICD-10-CM | POA: Diagnosis not present

## 2020-12-21 DIAGNOSIS — I10 Essential (primary) hypertension: Secondary | ICD-10-CM | POA: Diagnosis not present

## 2021-05-05 DIAGNOSIS — I10 Essential (primary) hypertension: Secondary | ICD-10-CM | POA: Diagnosis not present

## 2021-05-05 DIAGNOSIS — R634 Abnormal weight loss: Secondary | ICD-10-CM | POA: Diagnosis not present

## 2021-05-05 DIAGNOSIS — E785 Hyperlipidemia, unspecified: Secondary | ICD-10-CM | POA: Diagnosis not present

## 2021-05-05 DIAGNOSIS — R413 Other amnesia: Secondary | ICD-10-CM | POA: Diagnosis not present

## 2021-05-10 ENCOUNTER — Other Ambulatory Visit: Payer: Self-pay | Admitting: Family Medicine

## 2021-05-10 DIAGNOSIS — R413 Other amnesia: Secondary | ICD-10-CM

## 2021-06-30 DIAGNOSIS — R6889 Other general symptoms and signs: Secondary | ICD-10-CM | POA: Diagnosis not present

## 2021-06-30 DIAGNOSIS — I10 Essential (primary) hypertension: Secondary | ICD-10-CM | POA: Diagnosis not present

## 2021-07-28 DIAGNOSIS — R944 Abnormal results of kidney function studies: Secondary | ICD-10-CM | POA: Diagnosis not present

## 2021-09-23 DIAGNOSIS — Z1231 Encounter for screening mammogram for malignant neoplasm of breast: Secondary | ICD-10-CM | POA: Diagnosis not present

## 2021-12-13 DIAGNOSIS — I10 Essential (primary) hypertension: Secondary | ICD-10-CM | POA: Diagnosis not present

## 2021-12-13 DIAGNOSIS — F439 Reaction to severe stress, unspecified: Secondary | ICD-10-CM | POA: Diagnosis not present

## 2021-12-13 DIAGNOSIS — L988 Other specified disorders of the skin and subcutaneous tissue: Secondary | ICD-10-CM | POA: Diagnosis not present

## 2022-01-04 DIAGNOSIS — N1831 Chronic kidney disease, stage 3a: Secondary | ICD-10-CM | POA: Diagnosis not present

## 2022-01-04 DIAGNOSIS — F439 Reaction to severe stress, unspecified: Secondary | ICD-10-CM | POA: Diagnosis not present

## 2022-01-04 DIAGNOSIS — E785 Hyperlipidemia, unspecified: Secondary | ICD-10-CM | POA: Diagnosis not present

## 2022-01-04 DIAGNOSIS — Z23 Encounter for immunization: Secondary | ICD-10-CM | POA: Diagnosis not present

## 2022-01-04 DIAGNOSIS — Z1331 Encounter for screening for depression: Secondary | ICD-10-CM | POA: Diagnosis not present

## 2022-01-04 DIAGNOSIS — I10 Essential (primary) hypertension: Secondary | ICD-10-CM | POA: Diagnosis not present

## 2022-01-04 DIAGNOSIS — Z0000 Encounter for general adult medical examination without abnormal findings: Secondary | ICD-10-CM | POA: Diagnosis not present

## 2022-04-28 DIAGNOSIS — R413 Other amnesia: Secondary | ICD-10-CM | POA: Diagnosis not present

## 2022-05-05 ENCOUNTER — Other Ambulatory Visit: Payer: Self-pay | Admitting: Family Medicine

## 2022-05-05 DIAGNOSIS — R413 Other amnesia: Secondary | ICD-10-CM

## 2022-05-09 ENCOUNTER — Ambulatory Visit
Admission: RE | Admit: 2022-05-09 | Discharge: 2022-05-09 | Disposition: A | Discharge status: Home / Self Care | Payer: Medicare HMO | Source: Ambulatory Visit | Attending: Family Medicine | Admitting: Family Medicine

## 2022-05-09 DIAGNOSIS — R413 Other amnesia: Secondary | ICD-10-CM

## 2022-05-09 DIAGNOSIS — I619 Nontraumatic intracerebral hemorrhage, unspecified: Secondary | ICD-10-CM | POA: Diagnosis not present

## 2022-05-09 DIAGNOSIS — R410 Disorientation, unspecified: Secondary | ICD-10-CM | POA: Diagnosis not present

## 2022-05-09 DIAGNOSIS — J3489 Other specified disorders of nose and nasal sinuses: Secondary | ICD-10-CM | POA: Diagnosis not present

## 2022-05-09 DIAGNOSIS — S0990XA Unspecified injury of head, initial encounter: Secondary | ICD-10-CM | POA: Diagnosis not present

## 2022-06-28 DIAGNOSIS — R9431 Abnormal electrocardiogram [ECG] [EKG]: Secondary | ICD-10-CM | POA: Diagnosis not present

## 2022-06-28 DIAGNOSIS — R944 Abnormal results of kidney function studies: Secondary | ICD-10-CM | POA: Diagnosis not present

## 2022-06-28 DIAGNOSIS — F439 Reaction to severe stress, unspecified: Secondary | ICD-10-CM | POA: Diagnosis not present

## 2022-06-28 DIAGNOSIS — I10 Essential (primary) hypertension: Secondary | ICD-10-CM | POA: Diagnosis not present

## 2022-06-28 DIAGNOSIS — R079 Chest pain, unspecified: Secondary | ICD-10-CM | POA: Diagnosis not present

## 2022-07-06 NOTE — Progress Notes (Signed)
Cardiology Clinic Note   Patient Name: Monique Snyder Date of Encounter: 07/08/2022  Primary Care Provider:  Carol Ada, MD Primary Cardiologist:  [place]  Patient Profile    81 year old female with history of HTN, bradycardia, LVH, with most recent echocardiogram 07/17/2020 with moderate LVH with Grade I diastolic dysfunction, normal EF of 60-65%. Last seen by Dr. Audie Box on 06/19/2020.   Past Medical History    Past Medical History:  Diagnosis Date   Adenocarcinoma ([place])    Cecum mass    Herniated disc    Hypertension    Overweight(278.02)    Past Surgical History:  Procedure Laterality Date   COLECTOMY      Allergies  Allergies  Allergen Reactions   Penicillins     History of Present Illness    Mrs. Monique Snyder comes today for ongoing assessment and management of hypertension, LVH with grade 1 diastolic dysfunction and normal LVEF in 07/17/2020.  She is very tearful today and expresses a great deal of anxiety and depression concerning the care of her daughter who is recently had a stroke.  She states that the burden of caring for her has become too heavy, as there is a lot she has to do to take care of her as well as take her to appointments and PT.  She states that she has home health come and help her 3 times a week for bathing but otherwise she is total caregiver for her daughter.  This is created a good bit of anxiety and feelings of hopelessness and aloneness.  This is affecting her blood pressure and overall health status.  She also expresses very tearfully how much she misses her husband and the support he provided her during their marriage.  She denies chest pain, palpitations, shortness of breath, is chronically fatigued and feeling hopeless and overwhelmed.  Home Medications    Current Outpatient Medications  Medication Sig Dispense Refill   ALPRAZolam (XANAX) 0.5 MG tablet Take 0.5 mg by mouth at bedtime as needed for anxiety.     amLODipine (NORVASC) 10 MG  tablet Take 10 mg by mouth daily.     olmesartan-hydrochlorothiazide (BENICAR HCT) 40-12.5 MG tablet Take 1 tablet by mouth daily. 90 tablet 3   No current facility-administered medications for this visit.     Family History    Family History  Problem Relation Age of Onset   Heart disease Father    Heart disease Brother    She indicated that the status of her father is unknown. She indicated that the status of her brother is unknown.  Social History    Social History   Socioeconomic History   Marital status: Widowed    Spouse name: Not on file   Number of children: Not on file   Years of education: Not on file   Highest education level: Not on file  Occupational History   Occupation: retired  Tobacco Use   Smoking status: Never   Smokeless tobacco: Never  Substance and Sexual Activity   Alcohol use: No   Drug use: No   Sexual activity: Not on file  Other Topics Concern   Not on file  Social History Narrative   Not on file   Social Determinants of Health   Financial Resource Strain: Not on file  Food Insecurity: Not on file  Transportation Needs: Not on file  Physical Activity: Not on file  Stress: Not on file  Social Connections: Not on file  Intimate Partner Violence: Not  on file     Review of Systems    General:  No chills, fever, night sweats or weight changes.  Cardiovascular:  No chest pain, dyspnea on exertion, edema, orthopnea, palpitations, paroxysmal nocturnal dyspnea. Dermatological: No rash, lesions/masses Respiratory: No cough, dyspnea Urologic: No hematuria, dysuria Abdominal:   No nausea, vomiting, diarrhea, bright red blood per rectum, melena, or hematemesis Neurologic:  No visual changes, wkns, changes in mental status. All other systems reviewed and are otherwise negative except as noted above.     Physical Exam    VS:  BP (!) 152/74   Pulse 63   Ht '5\' 2"'$  (1.575 m)   Wt 165 lb 12.8 oz (75.2 kg)   SpO2 99%   BMI 30.33 kg/m  , BMI  Body mass index is 30.33 kg/m.     GEN: Well nourished, well developed, in no acute distress. HEENT: normal. Neck: Supple, no JVD, carotid bruits, or masses. Cardiac: RRR, no murmurs, rubs, or gallops. No clubbing, cyanosis, edema.  Radials/DP/PT 2+ and equal bilaterally.  Respiratory:  Respirations regular and unlabored, clear to auscultation bilaterally. GI: Soft, nontender, nondistended, BS + x 4. MS: no deformity or atrophy. Skin: warm and dry, no rash. Neuro:  Strength and sensation are intact. Psych: Normal affect.  Accessory Clinical Findings    ECG personally reviewed by me today-normal sinus rhythm heart rate of 63 bpm, possible left atrial enlargement- No acute changes since 2021.  Lab Results  Component Value Date   WBC 7.7 03/24/2007   HGB 9.0 (L) 03/25/2007   HCT 28.6 (L) 03/25/2007   MCV 81.3 03/24/2007   PLT 553 (H) 03/24/2007   Lab Results  Component Value Date   CREATININE 0.75 03/25/2007   BUN 2 (L) 03/25/2007   NA 142 03/25/2007   K 3.7 03/25/2007   CL 104 03/25/2007   CO2 30 03/25/2007   Lab Results  Component Value Date   ALT 26 03/23/2007   AST 29 03/23/2007   ALKPHOS 63 03/23/2007   BILITOT 0.2 (L) 03/23/2007   No results found for: "CHOL", "HDL", "LDLCALC", "LDLDIRECT", "TRIG", "CHOLHDL"  No results found for: "HGBA1C"  Review of Prior Studies: Echocardiogram 07/17/2020 1. Left ventricular ejection fraction, by estimation, is 60 to 65%. The  left ventricle has normal function. The left ventricle has no regional  wall motion abnormalities. There is moderate concentric left ventricular  hypertrophy. Left ventricular  diastolic parameters are consistent with Grade I diastolic dysfunction  (impaired relaxation).   2. Right ventricular systolic function is normal. The right ventricular  size is normal. There is normal pulmonary artery systolic pressure.   3. Left atrial size was mildly dilated.   4. The mitral valve is normal in structure.  Trivial mitral valve  regurgitation. No evidence of mitral stenosis.   5. The aortic valve is tricuspid. Aortic valve regurgitation is mild. No  aortic stenosis is present.   6. The inferior vena cava is normal in size with greater than 50%  respiratory variability, suggesting right atrial pressure of 3 mmHg.    Assessment & Plan   1.  Hypertension: Initially elevated on arrival, I rechecked it in the office blood pressure 134/64.  I will not change anything concerning her medication regimen at this time.  She appears asymptomatic other than having anxiety which does increase her blood pressure at times.  Will see her again in 1 year unless she is symptomatic  2.  Depression with anxiety: Extremely tearful, feeling overwhelmed and  alone caring for her daughter who is had a CVA, and is grieving the death of her husband.  I am going to refer her to counseling with Dr. Elias Else through behavioral health at [place].  I have asked her about this and she is willing to talk with him to help her feel supported and to give her tools to assist her with feeling overwhelmed and grieving her husband.  She may need to follow-up with PCP concerning changes in her medication regimen or addition of by SSRI to assist with depression.  Current medicines are reviewed at length with the patient today.  I have spent 25 min's  dedicated to the care of this patient on the date of this encounter to include pre-visit review of records, assessment, management and diagnostic testing,with shared decision making.  Signed, Phill Myron. West Pugh, ANP, [place]   07/08/2022 12:24 PM      Office [phone] Fax [phone]  Notice: This dictation was prepared with Dragon dictation along with smaller phrase technology. Any transcriptional errors that result from this process are unintentional and may not be corrected upon review.

## 2022-07-08 ENCOUNTER — Ambulatory Visit: Payer: Medicare HMO | Attending: Adult Health | Admitting: Adult Health

## 2022-07-08 ENCOUNTER — Encounter: Payer: Self-pay | Admitting: Adult Health

## 2022-07-08 VITALS — BP 152/74 | HR 63 | Ht 62.0 in | Wt 165.8 lb

## 2022-07-08 DIAGNOSIS — F32A Depression, unspecified: Secondary | ICD-10-CM | POA: Diagnosis not present

## 2022-07-08 DIAGNOSIS — F419 Anxiety disorder, unspecified: Secondary | ICD-10-CM | POA: Diagnosis not present

## 2022-07-08 DIAGNOSIS — I10 Essential (primary) hypertension: Secondary | ICD-10-CM

## 2022-07-08 MED ORDER — OLMESARTAN MEDOXOMIL-HCTZ 40-12.5 MG PO TABS: 1.0000 | ORAL_TABLET | Freq: Every day | ORAL | 3 refills | Status: DC

## 2022-07-08 NOTE — Patient Instructions (Signed)
Medication Instructions:  No Changes *If you need a refill on your cardiac medications before your next appointment, please call your pharmacy*   Lab Work: No Labs If you have labs (blood work) drawn today and your tests are completely normal, you will receive your results only by: [facility] (if you have MyChart) OR A paper copy in the mail If you have any lab test that is abnormal or we need to change your treatment, we will call you to review the results.   Testing/Procedures: No Testing   Follow-Up: At [facility], you and your health needs are our priority.  As part of our continuing mission to provide you with exceptional heart care, we have created designated Provider Care Teams.  These Care Teams include your primary Cardiologist (physician) and Advanced Practice Providers (APPs -  Physician Assistants and Nurse Practitioners) who all work together to provide you with the care you need, when you need it.  We recommend signing up for the patient portal called "MyChart".  Sign up information is provided on this After Visit Summary.  MyChart is used to connect with patients for Virtual Visits (Telemedicine).  Patients are able to view lab/test results, encounter notes, upcoming appointments, etc.  Non-urgent messages can be sent to your provider as well.   To learn more about what you can do with MyChart, go to [email or web address].    Your next appointment:   1 year(s)  The format for your next appointment:   In Person  Provider:   [place] T. Audie Box, MD

## 2023-08-09 ENCOUNTER — Other Ambulatory Visit: Payer: Self-pay | Admitting: Family Medicine

## 2023-08-09 DIAGNOSIS — R413 Other amnesia: Secondary | ICD-10-CM

## 2023-08-30 ENCOUNTER — Ambulatory Visit
Admission: RE | Admit: 2023-08-30 | Discharge: 2023-08-30 | Disposition: A | Discharge status: Home / Self Care | Payer: Self-pay | Source: Ambulatory Visit | Attending: Family Medicine | Admitting: Family Medicine

## 2023-08-30 DIAGNOSIS — R413 Other amnesia: Secondary | ICD-10-CM

## 2023-08-30 DIAGNOSIS — I6782 Cerebral ischemia: Secondary | ICD-10-CM | POA: Diagnosis not present

## 2023-08-30 DIAGNOSIS — G319 Degenerative disease of nervous system, unspecified: Secondary | ICD-10-CM | POA: Diagnosis not present

## 2023-09-01 ENCOUNTER — Other Ambulatory Visit: Payer: Medicare HMO

## 2023-09-25 DIAGNOSIS — I10 Essential (primary) hypertension: Secondary | ICD-10-CM | POA: Diagnosis not present

## 2023-09-27 NOTE — Progress Notes (Deleted)
 Assessment/Plan:     Monique Snyder is a very pleasant 83 y.o. year old RH female with a history of hypertension, hyperlipidemia,remote colon cancer, CKD, anxiety,chronic pain seen today for evaluation of memory loss. MoCA today is  /30***.  Workup is in progress, but findings are suspicious for dementia*** Patient needs assistance with some ADLs.  Patient no longer drives.  Dementia ***  MRI brain without contrast to assess for underlying structural abnormality and assess vascular load  Check B12, TSH Recommend good control of cardiovascular risk factors.   Continue to control mood as per PCP Folllow up in    Subjective:    The patient is accompanied by her daughter***  who supplements the history.    How long did patient have memory difficulties?  For the last3 years.  Patient has difficulty remembering new information, recent conversations and names of people.there is confabulation. For example, during her testing, she did not recognize animals because she does not live [place] a farm, or not knowing the date because she does not own a calendar,etc. repeats oneself?  Endorsed Disoriented when walking into a room?  Denies except occasionally not remembering what patient came to the room for ***  Leaving objects in unusual places?   Denies.  Wandering behavior? Endorsed. Daughter hd to call 911 because she could not find patient Any personality changes, or depression, anxiety?  She has been under significant amount of stress caring for her daughter who suffered a stroke. Has been demonstrating hoarding behavior, collecting empty containers.She is very confrontational with her daughter*** Hallucinations or paranoia?  Denies.   Seizures? Denies.    Any sleep changes?  Sleeps well***does not sleep well***denies vivid dreams, REM behavior or sleepwalking.   Sleep apnea? Denies.   Any hygiene concerns?  Denies.   Independent of bathing and dressing?  Needs assistance getting  dressed.*** Who is in charge of the medications? is in charge *** Who is in charge of the finances? Daughter  is in charge   *** Any changes in appetite? "She is a "picky eater".***   Patient have trouble swallowing?  Denies.   Does the patient cook? Yes, cooking spoiled food,, has been leaving the stove on.***  Any headaches?  Denies.   Chronic back pain?  Denies.   Ambulates with difficulty? Needs a walker to ambulate ***   Recent falls or head injuries? Denies.     Vision changes? Denies. Stroke like symptoms?  Denies.   Any tremors?  Denies.   Any anosmia?  Denies.   Any incontinence of urine? Denies.   Any bowel dysfunction? Denies.      Patient lives  ***  History of heavy alcohol intake? Denies.   History of heavy tobacco use? Denies.   Family history of dementia?   ***Denies. Does patient drive?***No longer drives Retired from [employer]  Allergies  Allergen Reactions   Penicillins     Current Outpatient Medications  Medication Instructions   ALPRAZolam (XANAX) 0.5 mg, At bedtime PRN   amLODipine (NORVASC) 10 mg, Daily   olmesartan-hydrochlorothiazide (BENICAR HCT) 40-12.5 MG tablet 1 tablet, Oral, Daily     VITALS:  There were no vitals filed for this visit.    PHYSICAL EXAM   HEENT:  Normocephalic, atraumatic. The mucous membranes are moist. The superficial temporal arteries are without ropiness or tenderness. Cardiovascular: Regular rate and rhythm. Lungs: Clear to auscultation bilaterally. Neck: There are no carotid bruits noted bilaterally.  NEUROLOGICAL:  No data to display              No data to display           Orientation:  Alert and oriented to person, not to place and time***. No aphasia or dysarthria. Fund of knowledge is reduced. Recent and remote memory impaired.  Attention and concentration are reduced.  Able to name objects and unable to repeat phrases. Delayed recall    Cranial nerves: There is good facial  symmetry. Extraocular muscles are intact and visual fields are full to confrontational testing. Speech is fluent and clear, no tongue deviation. Hearing is intact to conversational tone.*** Tone: Tone is good throughout. Sensation: Sensation is intact to light touch and pinprick throughout. Vibration is intact at the bilateral big toe. Coordination: The patient has no difficulty with RAM's or FNF bilaterally. Normal finger to nose  Motor: Strength is 5/5 in the bilateral upper and lower extremities. There is no pronator drift. There are no fasciculations noted. DTR's: Deep tendon reflexes are 2/4 .  Plantar responses are downgoing bilaterally. Gait and Station: The patient is able to ambulate with difficulty. Needs a walker to ambulate ***. Gait is cautious and narrow.      Thank you for allowing [place] the opportunity to participate in the care of this nice patient. Please do not hesitate to contact us for any questions or concerns.   Total time spent on today's visit was *** minutes dedicated to this patient today, preparing to see patient, examining the patient, ordering tests and/or medications and counseling the patient, documenting clinical information in the EHR or other health record, independently interpreting results and communicating results to the patient/family, discussing treatment and goals, answering patient's questions and coordinating care.  Cc:  Merri Brunette, MD  Marlowe Kays 09/27/2023 12:54 PM

## 2023-09-28 ENCOUNTER — Encounter: Payer: Self-pay | Admitting: Physician Assistant

## 2023-09-28 ENCOUNTER — Encounter

## 2023-09-28 DIAGNOSIS — Z029 Encounter for administrative examinations, unspecified: Secondary | ICD-10-CM

## 2023-09-29 DIAGNOSIS — Z1231 Encounter for screening mammogram for malignant neoplasm of breast: Secondary | ICD-10-CM | POA: Diagnosis not present

## 2023-09-29 DIAGNOSIS — I10 Essential (primary) hypertension: Secondary | ICD-10-CM | POA: Diagnosis not present

## 2023-09-29 DIAGNOSIS — N1831 Chronic kidney disease, stage 3a: Secondary | ICD-10-CM | POA: Diagnosis not present

## 2023-09-29 DIAGNOSIS — N184 Chronic kidney disease, stage 4 (severe): Secondary | ICD-10-CM | POA: Diagnosis not present

## 2023-09-29 DIAGNOSIS — N1832 Chronic kidney disease, stage 3b: Secondary | ICD-10-CM | POA: Diagnosis not present

## 2023-10-09 DIAGNOSIS — N1831 Chronic kidney disease, stage 3a: Secondary | ICD-10-CM | POA: Diagnosis not present

## 2023-10-28 DIAGNOSIS — N1832 Chronic kidney disease, stage 3b: Secondary | ICD-10-CM | POA: Diagnosis not present

## 2023-10-28 DIAGNOSIS — N1831 Chronic kidney disease, stage 3a: Secondary | ICD-10-CM | POA: Diagnosis not present

## 2023-10-28 DIAGNOSIS — N184 Chronic kidney disease, stage 4 (severe): Secondary | ICD-10-CM | POA: Diagnosis not present

## 2023-10-28 DIAGNOSIS — I10 Essential (primary) hypertension: Secondary | ICD-10-CM | POA: Diagnosis not present

## 2023-11-01 ENCOUNTER — Encounter: Admitting: Physician Assistant

## 2023-11-01 ENCOUNTER — Ambulatory Visit

## 2023-11-08 DIAGNOSIS — E785 Hyperlipidemia, unspecified: Secondary | ICD-10-CM | POA: Diagnosis not present

## 2023-11-08 DIAGNOSIS — N1832 Chronic kidney disease, stage 3b: Secondary | ICD-10-CM | POA: Diagnosis not present

## 2023-11-08 DIAGNOSIS — N184 Chronic kidney disease, stage 4 (severe): Secondary | ICD-10-CM | POA: Diagnosis not present

## 2023-11-08 DIAGNOSIS — N1831 Chronic kidney disease, stage 3a: Secondary | ICD-10-CM | POA: Diagnosis not present

## 2023-11-08 DIAGNOSIS — E669 Obesity, unspecified: Secondary | ICD-10-CM | POA: Diagnosis not present

## 2023-11-08 DIAGNOSIS — I10 Essential (primary) hypertension: Secondary | ICD-10-CM | POA: Diagnosis not present

## 2023-11-13 ENCOUNTER — Encounter: Admitting: Physician Assistant

## 2023-11-13 ENCOUNTER — Ambulatory Visit

## 2023-11-25 NOTE — Progress Notes (Addendum)
 Assessment/Plan:     Monique Snyder is a very pleasant 83 y.o. year old RH female with a history of hypertension, hyperlipidemia, CKD, depression, anxiety, bradycardia seen today for evaluation of memory loss. MoCA today is 7/30 .  Latest MRI brain remarkable for mild chronic microvascular changes, microhemorrages and some cerebral and cerebellar atrophy, findings suspicious for neurodegenerative disease such as Alzheimer's disease.  She is independent on most ADLs. needs an assistance with ADLs.  Continues to drive, although daughter voices concern, she is to undergo driving testing. Discussed starting memantine, goal of 10 mg bid, daughter agrees to proceed. Mood is depressed after the death of her husband and caring for her daughter who had a stroke in the recent past. Daughter to seek counseling for her.    Dementia, late onset, concern for Alzheimer's disease   Start memantine 5 g bid as directed, goal to increase to 10 mg bid.side effects discussed Check B12  Recommend good control of cardiovascular risk factors.   Continue to control mood as per PCP Recommend psychotherapy for situational depression and anxiety. Monitor driving. Folllow up in 3 months   Subjective:    The patient is accompanied by her daughter  who supplement  the history.    How long did patient have memory difficulties?  For about 3 years.  Patient reports some difficulty remembering new information, recent conversations, names.  There is confabulation. She carries a notepad and a pen but daughter is not sure if she writes anything. LTM is affected as well. Disoriented when walking into a room?  Patient denies   Leaving objects in unusual places? Endorsed by her daughter especially the phone and the glasses.   Wandering behavior? Denies.   Any personality changes, or depression, anxiety? She admits depression since her husband died in 19-Dec-2012 "he was my everything". She been under significant amount of stress as  she is caring for her daughter who has suffered a stroke in Dec 19, 2021.  She has been demonstrating hoarding behavior, including collecting empty used containers.  She is very confrontational with her daughter and granddaughter. Hallucinations or paranoia? Denies hallucinations, she is very protective of her purse. She could not find it recently, went to every store stating that it had been stolen, even calling the police, but the purse was at home.  Seizures? Denies.    Any sleep changes?  Sleeps well, denies frequent nightmares or dream reenactment, other REM behavior or sleepwalking   Sleep apnea? Denies.   Any hygiene concerns?  Needs reminder. Her daughter states she is not sure how frequently she bathes. Independent of bathing and dressing? Endorsed  Does the patient need help with medications?  Patient is in charge, denies forgetting any doses..  Who is in charge of the finances? Daughter is in charge     Any changes in appetite?   Denies.     Patient have trouble swallowing?  Denies.   Does the patient cook?  Yes, she has been cooking spoiled food, leaving the stove on. One time daughter had to call the fire department.  Any headaches?  Denies.   Chronic pain? Denies.   Ambulates with difficulty? Denies, walks 1.5 miles a day at the park. Recent falls or head injuries?  1 year ago she had a mechanical fall when she was reaching an object an hit her head. Did not LOC, did not get seen at the time.     Vision changes?  Denies any new issues.  Any strokelike symptoms? Denies.   Any tremors? Denies.   Any anosmia? Denies.   Any incontinence of urine? Denies.   Any bowel dysfunction? Denies.      Patient lives with her daughter    History of heavy alcohol intake? Denies.   History of heavy tobacco use? Denies.   Family history of dementia? Mother and sister with Alzheimer's  dementia  Does patient drive? She still drives, got lost 1 time without recurrence. Her daughter is concerned about  her driving. She drives short distances only. Daughter agrees.  Retired from [employer] in 2006, counts payable.  MRI of the brain, personally reviewed, remarkable for chronic small vessel ischemic changes within the cerebral white matter, mild for age, similar to prior MRI of October 2023.  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   Allergies  Allergen Reactions   Penicillins     Current Outpatient Medications  Medication Instructions   ALPRAZolam (XANAX) 0.5 mg, At bedtime PRN   amLODipine (NORVASC) 10 mg, Daily   memantine (NAMENDA) 5 MG tablet Take 1 tablet (5 mg at night) for 2 weeks, then increase to 1 tablet (5 mg) twice a day   olmesartan -hydrochlorothiazide (BENICAR  HCT) 40-12.5 MG tablet 1 tablet, Oral, Daily     VITALS:   Vitals:   11/28/23 0751  BP: 99/64  Pulse: (!) 54  Resp: 20  SpO2: 97%  Weight: 151 lb (68.5 kg)      PHYSICAL EXAM   HEENT:  Normocephalic, atraumatic.  The superficial temporal arteries are without ropiness or tenderness. Cardiovascular: Regular rate and rhythm. Lungs: Clear to auscultation bilaterally. Neck: There are no carotid bruits noted bilaterally.  NEUROLOGICAL:    11/28/2023    9:00 AM  Montreal Cognitive Assessment   Visuospatial/ Executive (0/5) 1  Naming (0/3) 3  Attention: Read list of digits (0/2) 0  Attention: Read list of letters (0/1) 0  Attention: Serial 7 subtraction starting at 100 (0/3) 0  Language: Repeat phrase (0/2) 0  Language : Fluency (0/1) 0  Abstraction (0/2) 0  Delayed Recall (0/5) 0  Orientation (0/6) 2  Total 6  Adjusted Score (based on education) 7        No data to display           Orientation:  Alert and oriented to person, not to place and not to time. No aphasia or dysarthria. Fund of knowledge is reduced. Recent and  remote memory impaired.  Attention and concentration are reduced .  Able to name objects 2/3  and unable to repeat phrases.   Delayed recall  0/5 .  Cranial nerves: There is good facial symmetry. Extraocular muscles are intact and visual fields are full to confrontational testing. Speech is fluent and clear. No tongue deviation. Hearing is intact to conversational tone.  Tone: Tone is good throughout. Sensation: Sensation is intact to light touch.  Vibration is intact at the bilateral big toe.  Coordination: The patient has no difficulty with RAM's or FNF bilaterally. Normal finger to nose  Motor: Strength is 5/5 in the bilateral upper and lower extremities. There is no pronator drift. There are no fasciculations noted. DTR's: Deep tendon reflexes are 2/4 bilaterally. Gait and Station: The patient is able to ambulate with difficulty.  Needs a walker to ambulate gait is cautious and narrow. Stride length is normal.  Thank you for allowing us  the opportunity to participate in the care of this nice patient. Please do not hesitate to contact us  for any questions or concerns.   Total time spent on today's visit was 60 minutes dedicated to this patient today, preparing to see patient, examining the patient, ordering tests and/or medications and counseling the patient, documenting clinical information in the EHR or other health record, independently interpreting results and communicating results to the patient/family, discussing treatment and goals, answering patient's questions and coordinating care.  Cc:  Faustina Hood, MD  Tex Filbert 11/28/2023 9:44 AM

## 2023-11-27 DIAGNOSIS — N184 Chronic kidney disease, stage 4 (severe): Secondary | ICD-10-CM | POA: Diagnosis not present

## 2023-11-27 DIAGNOSIS — N1831 Chronic kidney disease, stage 3a: Secondary | ICD-10-CM | POA: Diagnosis not present

## 2023-11-27 DIAGNOSIS — N1832 Chronic kidney disease, stage 3b: Secondary | ICD-10-CM | POA: Diagnosis not present

## 2023-11-27 DIAGNOSIS — I10 Essential (primary) hypertension: Secondary | ICD-10-CM | POA: Diagnosis not present

## 2023-11-28 ENCOUNTER — Other Ambulatory Visit

## 2023-11-28 ENCOUNTER — Ambulatory Visit (INDEPENDENT_AMBULATORY_CARE_PROVIDER_SITE_OTHER): Admitting: Physician Assistant

## 2023-11-28 ENCOUNTER — Encounter: Payer: Self-pay | Admitting: Physician Assistant

## 2023-11-28 ENCOUNTER — Ambulatory Visit

## 2023-11-28 VITALS — BP 99/64 | HR 54 | Resp 20 | Wt 151.0 lb

## 2023-11-28 DIAGNOSIS — R413 Other amnesia: Secondary | ICD-10-CM | POA: Diagnosis not present

## 2023-11-28 MED ORDER — MEMANTINE HCL 5 MG PO TABS: ORAL_TABLET | ORAL | 3 refills | Status: DC

## 2023-11-28 NOTE — Patient Instructions (Signed)
 It was a pleasure to see you today at our office.   Recommendations:   Check labs today   Start Memantine 5mg  tablets.  Take 1 tablet at bedtime for 2 weeks, then 1 tablet twice daily.     Follow up in 3 months Recommend visiting the website : " Dementia Success Path" to better understand some behaviors related to memory loss.  For psychiatric meds, mood meds: Please have your primary care physician manage these medications.  If you have any severe symptoms of a stroke, or other severe issues such as confusion,severe chills or fever, etc call 911 or go to the ER as you may need to be evaluated further For guidance regarding [facility] and if placement were needed at the facility, contact Social Worker tel: [phone]  For assessment of decision of mental capacity and competency:  Call Dr. Laverne Potter, geriatric psychiatrist at [phone] Counseling regarding caregiver distress, including caregiver depression, anxiety and issues regarding community resources, adult day care programs, adult living facilities, or memory care questions:  please contact your  Primary Doctor's Social Worker   FOR Memory  decline, memory medications: Call our office [phone]    [email or web address]   RECOMMENDATIONS FOR ALL PATIENTS WITH MEMORY PROBLEMS: 1. Continue to exercise (Recommend 30 minutes of walking everyday, or 3 hours every week) 2. Increase social interactions - continue going to [place] and enjoy social gatherings with friends and family 3. Eat healthy, avoid fried foods and eat more fruits and vegetables 4. Maintain adequate blood pressure, blood sugar, and blood cholesterol level. Reducing the risk of stroke and cardiovascular disease also helps promoting better memory. 5. Avoid stressful situations. Live a simple life and avoid aggravations. Organize your time and prepare for the next day in anticipation. 6. Sleep  well, avoid any interruptions of sleep and avoid any distractions in the bedroom that may interfere with adequate sleep quality 7. Avoid sugar, avoid sweets as there is a strong link between excessive sugar intake, diabetes, and cognitive impairment We discussed the Mediterranean diet, which has been shown to help patients reduce the risk of progressive memory disorders and reduces cardiovascular risk. This includes eating fish, eat fruits and green leafy vegetables, nuts like almonds and hazelnuts, walnuts, and also use olive oil. Avoid fast foods and fried foods as much as possible. Avoid sweets and sugar as sugar use has been linked to worsening of memory function.  There is always a concern of gradual progression of memory problems. If this is the case, then we may need to adjust level of care according to patient needs. Support, both to the patient and caregiver, should then be put into place.          DRIVING: Regarding driving, in patients with progressive memory problems, driving will be impaired. We advise to have someone else do the driving if trouble finding directions or if minor accidents are reported. Independent driving assessment is available to determine safety of driving.   If you are interested in the driving assessment, you can contact the following:  [employer] in [place] [phone]  [facility] [phone]  [facility] [phone]  [facility] [phone] or [phone]   FALL PRECAUTIONS: Be cautious when walking. Scan the area for obstacles that may increase the risk of trips and falls. When getting up in the mornings, sit up at the edge of the bed for a few minutes before getting out of bed. Consider elevating the bed at the  head end to avoid drop of blood pressure when getting up. Walk always in a well-lit room (use night lights in the walls). Avoid area rugs or power cords from appliances in the middle of  the walkways. Use a walker or a cane if necessary and consider physical therapy for balance exercise. Get your eyesight checked regularly.  FINANCIAL OVERSIGHT: Supervision, especially oversight when making financial decisions or transactions is also recommended.  HOME SAFETY: Consider the safety of the kitchen when operating appliances like stoves, microwave oven, and blender. Consider having supervision and share cooking responsibilities until no longer able to participate in those. Accidents with firearms and other hazards in the house should be identified and addressed as well.   ABILITY TO BE LEFT ALONE: If patient is unable to contact 911 operator, consider using LifeLine, or when the need is there, arrange for someone to stay with patients. Smoking is a fire hazard, consider supervision or cessation. Risk of wandering should be assessed by caregiver and if detected at any point, supervision and safe proof recommendations should be instituted.  MEDICATION SUPERVISION: Inability to self-administer medication needs to be constantly addressed. Implement a mechanism to ensure safe administration of the medications.      Mediterranean Diet A Mediterranean diet refers to food and lifestyle choices that are based on the traditions of countries located on the [employer]. This way of eating has been shown to help prevent certain conditions and improve outcomes for people who have chronic diseases, like kidney disease and heart disease. What are tips for following this plan? Lifestyle  Cook and eat meals together with your family, when possible. Drink enough fluid to keep your urine clear or pale yellow. Be physically active every day. This includes: Aerobic exercise like running or swimming. Leisure activities like gardening, walking, or housework. Get 7-8 hours of sleep each night. If recommended by your health care provider, drink red wine in moderation. This means 1 glass a day for  nonpregnant women and 2 glasses a day for men. A glass of wine equals 5 oz (150 mL). Reading food labels  Check the serving size of packaged foods. For foods such as rice and pasta, the serving size refers to the amount of cooked product, not dry. Check the total fat in packaged foods. Avoid foods that have saturated fat or trans fats. Check the ingredients list for added sugars, such as corn syrup. Shopping  At the grocery store, buy most of your food from the areas near the walls of the store. This includes: Fresh fruits and vegetables (produce). Grains, beans, nuts, and seeds. Some of these may be available in unpackaged forms or large amounts (in bulk). Fresh seafood. Poultry and eggs. Low-fat dairy products. Buy whole ingredients instead of prepackaged foods. Buy fresh fruits and vegetables in-season from local farmers markets. Buy frozen fruits and vegetables in resealable bags. If you do not have access to quality fresh seafood, buy precooked frozen shrimp or canned fish, such as tuna, salmon, or sardines. Buy small amounts of raw or cooked vegetables, salads, or olives from the deli or salad bar at your store. Stock your pantry so you always have certain foods on hand, such as olive oil, canned tuna, canned tomatoes, rice, pasta, and beans. Cooking  Cook foods with extra-virgin olive oil instead of using butter or other vegetable oils. Have meat as a side dish, and have vegetables or grains as your main dish. This means having meat in small portions or adding small  amounts of meat to foods like pasta or stew. Use beans or vegetables instead of meat in common dishes like chili or lasagna. Experiment with different cooking methods. Try roasting or broiling vegetables instead of steaming or sauteing them. Add frozen vegetables to soups, stews, pasta, or rice. Add nuts or seeds for added healthy fat at each meal. You can add these to yogurt, salads, or vegetable dishes. Marinate fish or  vegetables using olive oil, lemon juice, garlic, and fresh herbs. Meal planning  Plan to eat 1 vegetarian meal one day each week. Try to work up to 2 vegetarian meals, if possible. Eat seafood 2 or more times a week. Have healthy snacks readily available, such as: Vegetable sticks with hummus. Greek yogurt. Fruit and nut trail mix. Eat balanced meals throughout the week. This includes: Fruit: 2-3 servings a day Vegetables: 4-5 servings a day Low-fat dairy: 2 servings a day Fish, poultry, or lean meat: 1 serving a day Beans and legumes: 2 or more servings a week Nuts and seeds: 1-2 servings a day Whole grains: 6-8 servings a day Extra-virgin olive oil: 3-4 servings a day Limit red meat and sweets to only a few servings a month What are my food choices? Mediterranean diet Recommended Grains: Whole-grain pasta. Brown rice. Bulgar wheat. Polenta. Couscous. Whole-wheat bread. Dwyane Glad. Vegetables: Artichokes. Beets. Broccoli. Cabbage. Carrots. Eggplant. Green beans. Chard. Kale. Spinach. Onions. Leeks. Peas. Squash. Tomatoes. Peppers. Radishes. Fruits: Apples. Apricots. Avocado. Berries. Bananas. Cherries. Dates. Figs. Grapes. Lemons. Melon. Oranges. Peaches. Plums. Pomegranate. Meats and other protein foods: Beans. Almonds. Sunflower seeds. Pine nuts. Peanuts. Cod. Salmon. Scallops. Shrimp. Tuna. Tilapia. Clams. Oysters. Eggs. Dairy: Low-fat milk. Cheese. Greek yogurt. Beverages: Water. Red wine. Herbal tea. Fats and oils: Extra virgin olive oil. Avocado oil. Grape seed oil. Sweets and desserts: [place] yogurt with honey. Baked apples. Poached pears. Trail mix. Seasoning and other foods: Basil. Cilantro. Coriander. Cumin. Mint. Parsley. Sage. Rosemary. Tarragon. Garlic. Oregano. Thyme. Pepper. Balsalmic vinegar. Tahini. Hummus. Tomato sauce. Olives. Mushrooms. Limit these Grains: Prepackaged pasta or rice dishes. Prepackaged cereal with added sugar. Vegetables: Deep fried potatoes  (french fries). Fruits: Fruit canned in syrup. Meats and other protein foods: Beef. Pork. Lamb. Poultry with skin. Hot dogs. Helene Loader. Dairy: Ice cream. Sour cream. Whole milk. Beverages: Juice. Sugar-sweetened soft drinks. Beer. Liquor and spirits. Fats and oils: Butter. Canola oil. Vegetable oil. Beef fat (tallow). Lard. Sweets and desserts: Cookies. Cakes. Pies. Candy. Seasoning and other foods: Mayonnaise. Premade sauces and marinades. The items listed may not be a complete list. Talk with your dietitian about what dietary choices are right for you. Summary The Mediterranean diet includes both food and lifestyle choices. Eat a variety of fresh fruits and vegetables, beans, nuts, seeds, and whole grains. Limit the amount of red meat and sweets that you eat. Talk with your health care provider about whether it is safe for you to drink red wine in moderation. This means 1 glass a day for nonpregnant women and 2 glasses a day for men. A glass of wine equals 5 oz (150 mL). This information is not intended to replace advice given to you by your health care provider. Make sure you discuss any questions you have with your health care provider. Document Released: 02/18/2016 Document Revised: 03/22/2016 Document Reviewed: 02/18/2016 Elsevier Interactive Patient Education  2017 [employer].

## 2023-11-29 ENCOUNTER — Ambulatory Visit: Payer: Self-pay | Admitting: Physician Assistant

## 2023-11-29 LAB — VITAMIN B12: Vitamin B-12: 1341 pg/mL — ABNORMAL HIGH (ref 200–1100)

## 2023-11-29 NOTE — Progress Notes (Signed)
 Voicemail unable to message 11/29/2023 at 8:20am

## 2023-11-30 NOTE — Progress Notes (Signed)
 Patient and daughter advised of results. Patient and daughter request to have HIPAA form sent to them to fill out with the Daughter on it. So she can receive calls and results.

## 2023-12-09 DIAGNOSIS — N1832 Chronic kidney disease, stage 3b: Secondary | ICD-10-CM | POA: Diagnosis not present

## 2023-12-09 DIAGNOSIS — I10 Essential (primary) hypertension: Secondary | ICD-10-CM | POA: Diagnosis not present

## 2023-12-09 DIAGNOSIS — N1831 Chronic kidney disease, stage 3a: Secondary | ICD-10-CM | POA: Diagnosis not present

## 2023-12-09 DIAGNOSIS — E785 Hyperlipidemia, unspecified: Secondary | ICD-10-CM | POA: Diagnosis not present

## 2023-12-09 DIAGNOSIS — E669 Obesity, unspecified: Secondary | ICD-10-CM | POA: Diagnosis not present

## 2023-12-09 DIAGNOSIS — N184 Chronic kidney disease, stage 4 (severe): Secondary | ICD-10-CM | POA: Diagnosis not present

## 2023-12-13 ENCOUNTER — Telehealth: Payer: Self-pay | Admitting: Physician Assistant

## 2023-12-13 NOTE — Telephone Encounter (Signed)
 I left message on voicemail to call office unable to release any medical information, she isn't on the [facility] amanda. Will need to advise patient to all 911 if she feels she is in danger to herself, or nonemergency line.

## 2023-12-13 NOTE — Telephone Encounter (Signed)
 Abraham Hoffmann told me to put pt for a VV only 6/5 9am. She is scheduled. The daughter said her mother has left again and she is afraid for her and other peoples safety

## 2023-12-13 NOTE — Telephone Encounter (Signed)
 Pt's daughter said a lot has changed since her first visit. Her PCP told her to call sara and see if they can get her in asap. She is being defiant. Her mother is threatening to leave. She isnt happy at home. Lots of changes, and she wont listen. Can't cook vegi's anymore. Driving with the emergency brake up. Lack of cooperation, doesn't like the changes/doesn't think anything is wrong with her. She wont take her medication like she should. She wants to see sara asap. She is afraid her mother will kill someone driving or herself. She states she rather be dead.

## 2023-12-14 ENCOUNTER — Telehealth (INDEPENDENT_AMBULATORY_CARE_PROVIDER_SITE_OTHER): Admitting: Physician Assistant

## 2023-12-14 DIAGNOSIS — Z7189 Other specified counseling: Secondary | ICD-10-CM

## 2023-12-14 NOTE — Telephone Encounter (Signed)
 Currently doing VV at this time with Tex Filbert, PA-C. Will close encounter now.

## 2023-12-14 NOTE — Progress Notes (Addendum)
 Virtual Visit via Video Note The purpose of this virtual visit is to provide medical care in a patient that is unable to be seen in person due to physical or health limitations   Consent was obtained for video visit:  yes  Answered questions that patient had about telehealth interaction:  yes I discussed the limitations, risks, security and privacy concerns of performing an evaluation and management service by telemedicine. I also discussed with the patient that there may be a patient responsible charge related to this service. The patient expressed understanding and agreed to proceed.  Pt location: Home Physician Location: office Name of referring provider:  Claudene Pellet, MD I connected with Monique Snyder at patients initiation/request on 12/14/2023 at  9:00 AM EDT by video enabled telemedicine application and verified that I am speaking with the correct person using two identifiers. Pt MRN:  [id number] Pt DOB:  [birth year] Video Participants:  Monique Snyder;  daughter Monique Snyder    Assessment and Plan:    Monique Snyder is a delightful 83 y.o. RH female with a history of  hypertension, hyperlipidemia, CKD, depression, anxiety, bradycardia and a history of dementia likely due to Alzheimer's disease with behavioral disturbance, seen today in follow up prior to her scheduled visit due to increased friction at home, wanted to talk about this issue, counseling.  As recalled, she was last seen on 11/28/2023 at which time her MoCA was 7/30.  Latest MRI brain remarkable for mild chronic microvascular changes, microhemorrages and some cerebral and cerebellar atrophy, findings suspicious for neurodegenerative disease such as Alzheimer's disease.  Patient was on memantine  10 mg twice daily. There is no apparent aggressive behavior, hallucinations or paranoia.  Daughter and patient agree that there may be needed a change in the living situation, such as Assisted Living for safety, for social and  cognitive stimulation.They are to visit [facility] this week. Daughter wanted to confirm that this would be an appropriate decision, agree that this could be good for both parties, which could improve their relationship a great deal. All the concerns were addressed and answered to their satisfaction     For now continue memantine  10 mg twice daily, side effects discussed Monitor driving Patient scheduled for a visit on August 2025, will see her on her scheduled appointment Consider Assisted Living for safety, cognitive and social stimulation.   Initial visit 11/28/2023 How long did patient have memory difficulties?  For about 3 years.  Patient reports some difficulty remembering new information, recent conversations, names.  There is confabulation. She carries a notepad and a pen but daughter is not sure if she writes anything. LTM is affected as well. Disoriented when walking into a room?  Patient denies   Leaving objects in unusual places? Endorsed by her daughter especially the phone and the glasses.   Wandering behavior? Denies.   Any personality changes, or depression, anxiety? She admits depression since her husband died in 02-01-13 he was my everything. She been under significant amount of stress as she is caring for her daughter who has suffered a stroke in 2022-02-01.  She has been demonstrating hoarding behavior, including collecting empty used containers.  She is very confrontational with her daughter and granddaughter. Hallucinations or paranoia? Denies hallucinations, she is very protective of her purse. She could not find it recently, went to every store stating that it had been stolen, even calling the police, but the purse was at home.  Seizures? Denies.    Any sleep changes?  Sleeps well, denies  frequent nightmares or dream reenactment, other REM behavior or sleepwalking   Sleep apnea? Denies.   Any hygiene concerns?  Needs reminder. Her daughter states she is not sure how frequently she  bathes. Independent of bathing and dressing? Endorsed  Does the patient need help with medications?  Patient is in charge, denies forgetting any doses..  Who is in charge of the finances? Daughter is in charge     Any changes in appetite?   Denies.     Patient have trouble swallowing?  Denies.   Does the patient cook?  Yes, she has been cooking spoiled food, leaving the stove on. One time daughter had to call the fire department.  Any headaches?  Denies.   Chronic pain? Denies.   Ambulates with difficulty? Denies, walks 1.5 miles a day at the park. Recent falls or head injuries?  1 year ago she had a mechanical fall when she was reaching an object an hit her head. Did not LOC, did not get seen at the time.     Vision changes?  Denies any new issues.    Any strokelike symptoms? Denies.   Any tremors? Denies.   Any anosmia? Denies.   Any incontinence of urine? Denies.   Any bowel dysfunction? Denies.      Patient lives with her daughter    History of heavy alcohol intake? Denies.   History of heavy tobacco use? Denies.   Family history of dementia? Mother and sister with Alzheimer's  dementia  Does patient drive? She still drives, got lost 1 time without recurrence. Her daughter is concerned about her driving. She drives short distances only. Daughter agrees.  Retired from [employer] in 2006, counts payable.   MRI of the brain, personally reviewed, remarkable for chronic small vessel ischemic changes within the cerebral white matter, mild for age, similar to prior MRI of October 2023.  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   Current Outpatient Medications on File Prior to Visit  Medication Sig Dispense Refill   ALPRAZolam (XANAX) 0.5 MG tablet Take 0.5 mg by mouth at bedtime as needed for anxiety.     amLODipine  (NORVASC) 10 MG tablet Take 10 mg by mouth daily.     memantine  (NAMENDA ) 5 MG tablet Take 1 tablet (5 mg at night) for 2 weeks, then increase to 1 tablet (5 mg) twice a day 180 tablet 3   olmesartan -hydrochlorothiazide (BENICAR  HCT) 40-12.5 MG tablet Take 1 tablet by mouth daily. 90 tablet 3   No current facility-administered medications on file prior to visit.         Follow Up Instructions:    -I discussed the assessment and treatment plan with the patient. The patient was provided an opportunity to ask questions and all were answered. The patient agreed with the plan and demonstrated an understanding of the instructions.   The patient was advised to call back or seek an in-person evaluation if the symptoms worsen or if the condition fails to improve as anticipated.    Total time spent on today's visit was 35 minutes, including both face-to-face time and nonface-to-face time.  Time included that spent on review of records (prior notes available to me/labs/imaging if pertinent), discussing treatment and goals, answering patient's questions and coordinating care.   Camie Sevin, PA-C

## 2023-12-14 NOTE — Telephone Encounter (Signed)
 Unable to talk to [facility] she is not on the [facility].

## 2023-12-14 NOTE — Telephone Encounter (Signed)
 Pt's daughter called in stating she would like to speak with the nurse. There were several things she did not have time to talk about in the visit today that she feels Monique Snyder needs to be aware of.   The pt does not want to live with her anymore. She wants to have her own place. She wants to come and go as she pleases. She doesn't want anyone to tell her what to do. They went to [facility] the other day, but the pt is not wanting to sign the papers.

## 2023-12-19 DIAGNOSIS — N1831 Chronic kidney disease, stage 3a: Secondary | ICD-10-CM | POA: Diagnosis not present

## 2023-12-19 DIAGNOSIS — G309 Alzheimer's disease, unspecified: Secondary | ICD-10-CM | POA: Diagnosis not present

## 2023-12-19 DIAGNOSIS — R634 Abnormal weight loss: Secondary | ICD-10-CM | POA: Diagnosis not present

## 2023-12-19 DIAGNOSIS — E785 Hyperlipidemia, unspecified: Secondary | ICD-10-CM | POA: Diagnosis not present

## 2023-12-19 DIAGNOSIS — I10 Essential (primary) hypertension: Secondary | ICD-10-CM | POA: Diagnosis not present

## 2023-12-19 DIAGNOSIS — F419 Anxiety disorder, unspecified: Secondary | ICD-10-CM | POA: Diagnosis not present

## 2023-12-27 DIAGNOSIS — N1832 Chronic kidney disease, stage 3b: Secondary | ICD-10-CM | POA: Diagnosis not present

## 2023-12-27 DIAGNOSIS — N1831 Chronic kidney disease, stage 3a: Secondary | ICD-10-CM | POA: Diagnosis not present

## 2023-12-27 DIAGNOSIS — N184 Chronic kidney disease, stage 4 (severe): Secondary | ICD-10-CM | POA: Diagnosis not present

## 2023-12-27 DIAGNOSIS — I10 Essential (primary) hypertension: Secondary | ICD-10-CM | POA: Diagnosis not present

## 2024-01-08 DIAGNOSIS — E669 Obesity, unspecified: Secondary | ICD-10-CM | POA: Diagnosis not present

## 2024-01-08 DIAGNOSIS — I10 Essential (primary) hypertension: Secondary | ICD-10-CM | POA: Diagnosis not present

## 2024-01-08 DIAGNOSIS — N1832 Chronic kidney disease, stage 3b: Secondary | ICD-10-CM | POA: Diagnosis not present

## 2024-01-08 DIAGNOSIS — N1831 Chronic kidney disease, stage 3a: Secondary | ICD-10-CM | POA: Diagnosis not present

## 2024-01-08 DIAGNOSIS — F4322 Adjustment disorder with anxiety: Secondary | ICD-10-CM | POA: Diagnosis not present

## 2024-01-08 DIAGNOSIS — N184 Chronic kidney disease, stage 4 (severe): Secondary | ICD-10-CM | POA: Diagnosis not present

## 2024-01-08 DIAGNOSIS — E785 Hyperlipidemia, unspecified: Secondary | ICD-10-CM | POA: Diagnosis not present

## 2024-01-10 ENCOUNTER — Ambulatory Visit: Admitting: Physician Assistant

## 2024-01-10 NOTE — Progress Notes (Incomplete)
 Assessment/Plan:   Dementia likely due to Alzheimer disease***  Monique Snyder is a delightful 83 y.o. RH female with a history ofhypertension, hyperlipidemia, CKD, depression, anxiety, bradycardia and a history of dementia likely due to Alzheimer's disease with behavioral disturbance seen today in follow up for memory loss. Patient is currently on memantine  10 mg twice daily.  Memory is***.  She is still able to participate in her ADLs and to drive.  Daughter is entertaining assisted living for the patient***.      Follow up in   months. Continue memantine  10 mg twice daily, side effects discussed*** Recommend good control of her cardiovascular risk factors Continue to control mood as per PCP Consider assisted living for safety, cognitive and social stimulation     Subjective:    This patient is accompanied in the office by her daughter who supplements the history.  Previous records as well as any outside records available were reviewed prior to todays visit. Patient was last seen on video prior to her scheduled visit due to friction at home, counseling regarding this issue.  Prior to that she was seen on 11/28/2023 with [facility] of 7/30.***   Any changes in memory since last visit? .  Since the last visit, both short-term and long-term are affected, with difficulty remembering new information, recent conversations and names, and some distant events.  She also shows confabulation.  She carries a notepad and a pen, although daughter is not sure if she writes anything. repeats oneself?  Endorsed Disoriented when walking into a room? Denies ***  Leaving objects?  Endorsed by her daughter, especially with phone and glasses***  Wandering behavior?  denies   Any personality changes since last visit?  Denies.   Any worsening depression?:  She admits feeling depressed after the death of her husband in 2013-01-28, and has undergone significant amount of stress due to her living situation, with her  daughter who suffered a stroke in January 28, 2021 when she has to care for her.  She also demonstrates hoarding behavior, collects used containers.  She becomes very confrontational with her daughter and granddaughter. Hallucinations or paranoia?  Denies hallucinations.  She is very protective of her purse, paranoid about it  Seizures? denies    Any sleep changes?  Sleeps well denies vivid dreams, REM behavior or sleepwalking   Sleep apnea?   Denies.   Any hygiene concerns?  Needs reminder. Independent of bathing and dressing?  Endorsed  Does the patient needs help with medications?  Patient is in charge, but her daughter is not sure that she is taking the medicines*** Who is in charge of the finances?  Daughter is in charge   *** Any changes in appetite?  denies ***   Patient have trouble swallowing? Denies.   Does the patient cook?  Yes, she has been cooking spoiled food and leaving the stove on  any headaches?   denies   Any vision changes?*** Chronic back pain  denies   Ambulates with difficulty? Denies.  She likes to go to the park, walk 1-1/2 miles a day*** Recent falls or head injuries? Denies.     Unilateral weakness, numbness or tingling? denies   Any tremors?  Denies  *** Any anosmia?  Denies   Any incontinence of urine?  Denies***  Any bowel dysfunction?   Denies      Patient lives her daughter who suffered a stroke a couple of years ago.*** Does the patient drive?  She drives short distances only, daughter is  concerned about her driving***   Initial visit 11/28/2023 How long did patient have memory difficulties?  For about 3 years.  Patient reports some difficulty remembering new information, recent conversations, names.  There is confabulation. She carries a notepad and a pen but daughter is not sure if she writes anything. LTM is affected as well. Disoriented when walking into a room?  Patient denies   Leaving objects in unusual places? Endorsed by her daughter especially the phone  and the glasses.   Wandering behavior? Denies.   Any personality changes, or depression, anxiety? She admits depression since her husband died in Jan 18, 2013 he was my everything. She been under significant amount of stress as she is caring for her daughter who has suffered a stroke in 01/18/2022.  She has been demonstrating hoarding behavior, including collecting empty used containers.  She is very confrontational with her daughter and granddaughter. Hallucinations or paranoia? Denies hallucinations, she is very protective of her purse. She could not find it recently, went to every store stating that it had been stolen, even calling the police, but the purse was at home.  Seizures? Denies.    Any sleep changes?  Sleeps well, denies frequent nightmares or dream reenactment, other REM behavior or sleepwalking   Sleep apnea? Denies.   Any hygiene concerns?  Needs reminder. Her daughter states she is not sure how frequently she bathes. Independent of bathing and dressing? Endorsed  Does the patient need help with medications?  Patient is in charge, denies forgetting any doses..  Who is in charge of the finances? Daughter is in charge     Any changes in appetite?   Denies.     Patient have trouble swallowing?  Denies.   Does the patient cook?  Yes, she has been cooking spoiled food, leaving the stove on. One time daughter had to call the fire department.  Any headaches?  Denies.   Chronic pain? Denies.   Ambulates with difficulty? Denies, walks 1.5 miles a day at the park. Recent falls or head injuries?  1 year ago she had a mechanical fall when she was reaching an object an hit her head. Did not LOC, did not get seen at the time.     Vision changes?  Denies any new issues.    Any strokelike symptoms? Denies.   Any tremors? Denies.   Any anosmia? Denies.   Any incontinence of urine? Denies.   Any bowel dysfunction? Denies.      Patient lives with her daughter    History of heavy alcohol intake? Denies.    History of heavy tobacco use? Denies.   Family history of dementia? Mother and sister with Alzheimer's  dementia  Does patient drive? She still drives, got lost 1 time without recurrence. Her daughter is concerned about her driving. She drives short distances only. Daughter agrees.  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   MRI of the brain, personally reviewed, remarkable for chronic small vessel ischemic changes within the cerebral white matter, mild for age, similar to prior MRI of October 2023.  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  PREVIOUS MEDICATIONS:   CURRENT MEDICATIONS:  Outpatient Encounter Medications as of 01/10/2024  Medication Sig   ALPRAZolam (XANAX) 0.5 MG tablet Take 0.5 mg by mouth at bedtime as needed for anxiety.   amLODipine (NORVASC) 10 MG tablet Take 10 mg by mouth daily.  memantine  (NAMENDA ) 5 MG tablet Take 1 tablet (5 mg at night) for 2 weeks, then increase to 1 tablet (5 mg) twice a day   olmesartan -hydrochlorothiazide (BENICAR  HCT) 40-12.5 MG tablet Take 1 tablet by mouth daily.   No facility-administered encounter medications on file as of 01/10/2024.        No data to display            11/28/2023    9:00 AM  Montreal Cognitive Assessment   Visuospatial/ Executive (0/5) 1  Naming (0/3) 2  Attention: Read list of digits (0/2) 0  Attention: Read list of letters (0/1) 0  Attention: Serial 7 subtraction starting at 100 (0/3) 0  Language: Repeat phrase (0/2) 0  Language : Fluency (0/1) 0  Abstraction (0/2) 0  Delayed Recall (0/5) 0  Orientation (0/6) 2  Total 5  Adjusted Score (based on education) 6    Objective:     PHYSICAL EXAMINATION:    VITALS:  There were no vitals filed for this visit.  GEN:  The patient appears stated age and is in NAD. HEENT:   Normocephalic, atraumatic.   Neurological examination:  General: NAD, well-groomed, appears stated age. Orientation: The patient is alert. Oriented to person, not to place and date Cranial nerves: There is good facial symmetry.The speech is fluent and clear. No aphasia or dysarthria. Fund of knowledge is reduced. Recent and remote memory are impaired. Attention and concentration are reduced. Able to name objects and repeat phrases.  Hearing is intact to conversational tone. *** Sensation: Sensation is intact to light touch throughout Motor: Strength is at least antigravity x4. DTR's 2/4 in UE/LE     Movement examination: Tone: There is normal tone in the UE/LE Abnormal movements:  no tremor.  No myoclonus.  No asterixis.   Coordination:  There is no decremation with RAM's. Normal finger to nose  Gait and Station: The patient has no*** difficulty arising out of a deep-seated chair without the use of the hands. The patient's stride length is good.  Gait is cautious and narrow.    Thank you for allowing us  the opportunity to participate in the care of this nice patient. Please do not hesitate to contact us  for any questions or concerns.   Total time spent on today's visit was *** minutes dedicated to this patient today, preparing to see patient, examining the patient, ordering tests and/or medications and counseling the patient, documenting clinical information in the EHR or other health record, independently interpreting results and communicating results to the patient/family, discussing treatment and goals, answering patient's questions and coordinating care.  Cc:  Claudene Pellet, MD  Camie Sevin 01/10/2024 6:26 AM

## 2024-01-11 ENCOUNTER — Encounter: Payer: Self-pay | Admitting: Physician Assistant

## 2024-01-18 DIAGNOSIS — F419 Anxiety disorder, unspecified: Secondary | ICD-10-CM | POA: Diagnosis not present

## 2024-01-18 DIAGNOSIS — Z0000 Encounter for general adult medical examination without abnormal findings: Secondary | ICD-10-CM | POA: Diagnosis not present

## 2024-01-18 DIAGNOSIS — I10 Essential (primary) hypertension: Secondary | ICD-10-CM | POA: Diagnosis not present

## 2024-01-18 DIAGNOSIS — Z1331 Encounter for screening for depression: Secondary | ICD-10-CM | POA: Diagnosis not present

## 2024-01-18 DIAGNOSIS — F0390 Unspecified dementia without behavioral disturbance: Secondary | ICD-10-CM | POA: Diagnosis not present

## 2024-01-18 DIAGNOSIS — N1831 Chronic kidney disease, stage 3a: Secondary | ICD-10-CM | POA: Diagnosis not present

## 2024-01-18 DIAGNOSIS — E785 Hyperlipidemia, unspecified: Secondary | ICD-10-CM | POA: Diagnosis not present

## 2024-01-25 ENCOUNTER — Ambulatory Visit: Admitting: Physician Assistant

## 2024-01-25 ENCOUNTER — Encounter: Payer: Self-pay | Admitting: Physician Assistant

## 2024-01-25 NOTE — Progress Notes (Incomplete)
 Assessment/Plan:   Dementia likely due to Alzheimer disease***  Monique Snyder is a delightful 83 y.o. RH female with a history ofhypertension, hyperlipidemia, CKD, depression, anxiety, bradycardia and a history of dementia likely due to Alzheimer's disease with behavioral disturbance seen today in follow up for memory loss. Patient is currently on memantine  10 mg twice daily.  Memory is***.  She is still able to participate in her ADLs and to drive.  Daughter is entertaining assisted living for the patient***.      Follow up in   months. Continue memantine  10 mg twice daily, side effects discussed*** Recommend good control of her cardiovascular risk factors Continue to control mood as per PCP Consider assisted living for safety, cognitive and social stimulation     Subjective:    This patient is accompanied in the office by her daughter who supplements the history.  Previous records as well as any outside records available were reviewed prior to todays visit. Patient was last seen on video prior to her scheduled visit due to friction at home, counseling regarding this issue.  Prior to that she was seen on 11/28/2023 with [facility] of 6/30.***   Any changes in memory since last visit? .  Since the last visit, both short-term and long-term are affected, with difficulty remembering new information, recent conversations and names, and some distant events.  She also shows confabulation.  She carries a notepad and a pen, although daughter is not sure if she writes anything. repeats oneself?  Endorsed Disoriented when walking into a room? Denies ***  Leaving objects?  Endorsed by her daughter, especially with phone and glasses***  Wandering behavior?  denies   Any personality changes since last visit?  Denies.   Any worsening depression?:  She admits feeling depressed after the death of her husband in February 16, 2013, and has undergone significant amount of stress due to her living situation, with  her daughter who suffered a stroke in 2021-02-16 when she has to care for her.  She also demonstrates hoarding behavior, collects used containers.  She becomes very confrontational with her daughter and granddaughter. Hallucinations or paranoia?  Denies hallucinations.  She is very protective of her purse, paranoid about it  Seizures? denies    Any sleep changes?  Sleeps well denies vivid dreams, REM behavior or sleepwalking   Sleep apnea?   Denies.   Any hygiene concerns?  Needs reminder. Independent of bathing and dressing?  Endorsed  Does the patient needs help with medications?  Patient is in charge, but her daughter is not sure that she is taking the medicines*** Who is in charge of the finances?  Daughter is in charge   *** Any changes in appetite?  denies ***   Patient have trouble swallowing? Denies.   Does the patient cook?  Yes, she has been cooking spoiled food and leaving the stove on  any headaches?   denies   Any vision changes?*** Chronic back pain  denies   Ambulates with difficulty? Denies.  She likes to go to the park, walk 1-1/2 miles a day*** Recent falls or head injuries? Denies.     Unilateral weakness, numbness or tingling? denies   Any tremors?  Denies  *** Any anosmia?  Denies   Any incontinence of urine?  Denies***  Any bowel dysfunction?   Denies      Patient lives her daughter who suffered a stroke a couple of years ago.*** Does the patient drive?  She drives short  distances only, daughter is concerned about her driving***   Initial visit 11/28/2023 How long did patient have memory difficulties?  For about 3 years.  Patient reports some difficulty remembering new information, recent conversations, names.  There is confabulation. She carries a notepad and a pen but daughter is not sure if she writes anything. LTM is affected as well. Disoriented when walking into a room?  Patient denies   Leaving objects in unusual places? Endorsed by her daughter especially the  phone and the glasses.   Wandering behavior? Denies.   Any personality changes, or depression, anxiety? She admits depression since her husband died in 02/05/2013 he was my everything. She been under significant amount of stress as she is caring for her daughter who has suffered a stroke in 02/05/22.  She has been demonstrating hoarding behavior, including collecting empty used containers.  She is very confrontational with her daughter and granddaughter. Hallucinations or paranoia? Denies hallucinations, she is very protective of her purse. She could not find it recently, went to every store stating that it had been stolen, even calling the police, but the purse was at home.  Seizures? Denies.    Any sleep changes?  Sleeps well, denies frequent nightmares or dream reenactment, other REM behavior or sleepwalking   Sleep apnea? Denies.   Any hygiene concerns?  Needs reminder. Her daughter states she is not sure how frequently she bathes. Independent of bathing and dressing? Endorsed  Does the patient need help with medications?  Patient is in charge, denies forgetting any doses..  Who is in charge of the finances? Daughter is in charge     Any changes in appetite?   Denies.     Patient have trouble swallowing?  Denies.   Does the patient cook?  Yes, she has been cooking spoiled food, leaving the stove on. One time daughter had to call the fire department.  Any headaches?  Denies.   Chronic pain? Denies.   Ambulates with difficulty? Denies, walks 1.5 miles a day at the park. Recent falls or head injuries?  1 year ago she had a mechanical fall when she was reaching an object an hit her head. Did not LOC, did not get seen at the time.     Vision changes?  Denies any new issues.    Any strokelike symptoms? Denies.   Any tremors? Denies.   Any anosmia? Denies.   Any incontinence of urine? Denies.   Any bowel dysfunction? Denies.      Patient lives with her daughter    History of heavy alcohol intake?  Denies.   History of heavy tobacco use? Denies.   Family history of dementia? Mother and sister with Alzheimer's  dementia  Does patient drive? She still drives, got lost 1 time without recurrence. Her daughter is concerned about her driving. She drives short distances only. Daughter agrees.  Retired from [employer] in 02-05-05, counts payable.   MRI of the brain, personally reviewed, remarkable for chronic small vessel ischemic changes within the cerebral white matter, mild for age, similar to prior MRI of October 2023.  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  PREVIOUS MEDICATIONS:   CURRENT MEDICATIONS:  Outpatient Encounter Medications as of 01/25/2024  Medication Sig   ALPRAZolam (XANAX) 0.5 MG tablet Take 0.5 mg by mouth at bedtime as needed for anxiety.   amLODipine (NORVASC) 10 MG tablet Take 10  mg by mouth daily.   memantine  (NAMENDA ) 5 MG tablet Take 1 tablet (5 mg at night) for 2 weeks, then increase to 1 tablet (5 mg) twice a day   olmesartan -hydrochlorothiazide (BENICAR  HCT) 40-12.5 MG tablet Take 1 tablet by mouth daily.   No facility-administered encounter medications on file as of 01/25/2024.        No data to display            11/28/2023    9:00 AM  Montreal Cognitive Assessment   Visuospatial/ Executive (0/5) 1  Naming (0/3) 2  Attention: Read list of digits (0/2) 0  Attention: Read list of letters (0/1) 0  Attention: Serial 7 subtraction starting at 100 (0/3) 0  Language: Repeat phrase (0/2) 0  Language : Fluency (0/1) 0  Abstraction (0/2) 0  Delayed Recall (0/5) 0  Orientation (0/6) 2  Total 5  Adjusted Score (based on education) 6    Objective:     PHYSICAL EXAMINATION:    VITALS:  There were no vitals filed for this visit.  GEN:  The patient appears stated age and is in  NAD. HEENT:  Normocephalic, atraumatic.   Neurological examination:  General: NAD, well-groomed, appears stated age. Orientation: The patient is alert. Oriented to person, not to place and date Cranial nerves: There is good facial symmetry.The speech is fluent and clear. No aphasia or dysarthria. Fund of knowledge is reduced. Recent and remote memory are impaired. Attention and concentration are reduced. Able to name objects and repeat phrases.  Hearing is intact to conversational tone. *** Sensation: Sensation is intact to light touch throughout Motor: Strength is at least antigravity x4. DTR's 2/4 in UE/LE     Movement examination: Tone: There is normal tone in the UE/LE Abnormal movements:  no tremor.  No myoclonus.  No asterixis.   Coordination:  There is no decremation with RAM's. Normal finger to nose  Gait and Station: The patient has no*** difficulty arising out of a deep-seated chair without the use of the hands. The patient's stride length is good.  Gait is cautious and narrow.    Thank you for allowing us  the opportunity to participate in the care of this nice patient. Please do not hesitate to contact us  for any questions or concerns.   Total time spent on today's visit was *** minutes dedicated to this patient today, preparing to see patient, examining the patient, ordering tests and/or medications and counseling the patient, documenting clinical information in the EHR or other health record, independently interpreting results and communicating results to the patient/family, discussing treatment and goals, answering patient's questions and coordinating care.  Cc:  Claudene Pellet, MD  Camie Sevin 01/25/2024 6:49 AM

## 2024-01-26 DIAGNOSIS — N184 Chronic kidney disease, stage 4 (severe): Secondary | ICD-10-CM | POA: Diagnosis not present

## 2024-01-26 DIAGNOSIS — N1832 Chronic kidney disease, stage 3b: Secondary | ICD-10-CM | POA: Diagnosis not present

## 2024-01-26 DIAGNOSIS — I10 Essential (primary) hypertension: Secondary | ICD-10-CM | POA: Diagnosis not present

## 2024-01-26 DIAGNOSIS — N1831 Chronic kidney disease, stage 3a: Secondary | ICD-10-CM | POA: Diagnosis not present

## 2024-02-08 DIAGNOSIS — E785 Hyperlipidemia, unspecified: Secondary | ICD-10-CM | POA: Diagnosis not present

## 2024-02-08 DIAGNOSIS — N184 Chronic kidney disease, stage 4 (severe): Secondary | ICD-10-CM | POA: Diagnosis not present

## 2024-02-08 DIAGNOSIS — N1832 Chronic kidney disease, stage 3b: Secondary | ICD-10-CM | POA: Diagnosis not present

## 2024-02-08 DIAGNOSIS — E669 Obesity, unspecified: Secondary | ICD-10-CM | POA: Diagnosis not present

## 2024-02-08 DIAGNOSIS — I10 Essential (primary) hypertension: Secondary | ICD-10-CM | POA: Diagnosis not present

## 2024-02-08 DIAGNOSIS — N1831 Chronic kidney disease, stage 3a: Secondary | ICD-10-CM | POA: Diagnosis not present

## 2024-02-29 ENCOUNTER — Ambulatory Visit: Admitting: Physician Assistant

## 2024-03-05 ENCOUNTER — Inpatient Hospital Stay (HOSPITAL_COMMUNITY)
Admission: EM | Admit: 2024-03-05 | Discharge: 2024-03-07 | DRG: 689 | Disposition: A | Discharge status: Home / Self Care | Attending: Family Medicine | Admitting: Family Medicine

## 2024-03-05 ENCOUNTER — Other Ambulatory Visit: Payer: Self-pay

## 2024-03-05 ENCOUNTER — Emergency Department (HOSPITAL_COMMUNITY)

## 2024-03-05 ENCOUNTER — Encounter (HOSPITAL_COMMUNITY): Payer: Self-pay

## 2024-03-05 DIAGNOSIS — R011 Cardiac murmur, unspecified: Secondary | ICD-10-CM | POA: Diagnosis not present

## 2024-03-05 DIAGNOSIS — Z8673 Personal history of transient ischemic attack (TIA), and cerebral infarction without residual deficits: Secondary | ICD-10-CM

## 2024-03-05 DIAGNOSIS — Z1152 Encounter for screening for COVID-19: Secondary | ICD-10-CM

## 2024-03-05 DIAGNOSIS — F0393 Unspecified dementia, unspecified severity, with mood disturbance: Secondary | ICD-10-CM | POA: Diagnosis present

## 2024-03-05 DIAGNOSIS — R4781 Slurred speech: Secondary | ICD-10-CM | POA: Diagnosis present

## 2024-03-05 DIAGNOSIS — R413 Other amnesia: Secondary | ICD-10-CM | POA: Diagnosis not present

## 2024-03-05 DIAGNOSIS — R4182 Altered mental status, unspecified: Secondary | ICD-10-CM | POA: Diagnosis not present

## 2024-03-05 DIAGNOSIS — S0990XA Unspecified injury of head, initial encounter: Secondary | ICD-10-CM | POA: Diagnosis not present

## 2024-03-05 DIAGNOSIS — S199XXA Unspecified injury of neck, initial encounter: Secondary | ICD-10-CM | POA: Diagnosis not present

## 2024-03-05 DIAGNOSIS — R001 Bradycardia, unspecified: Secondary | ICD-10-CM | POA: Diagnosis not present

## 2024-03-05 DIAGNOSIS — I10 Essential (primary) hypertension: Secondary | ICD-10-CM | POA: Diagnosis not present

## 2024-03-05 DIAGNOSIS — E785 Hyperlipidemia, unspecified: Secondary | ICD-10-CM | POA: Diagnosis present

## 2024-03-05 DIAGNOSIS — F32A Depression, unspecified: Secondary | ICD-10-CM | POA: Diagnosis present

## 2024-03-05 DIAGNOSIS — W19XXXA Unspecified fall, initial encounter: Secondary | ICD-10-CM | POA: Diagnosis present

## 2024-03-05 DIAGNOSIS — N390 Urinary tract infection, site not specified: Principal | ICD-10-CM | POA: Diagnosis present

## 2024-03-05 DIAGNOSIS — N1832 Chronic kidney disease, stage 3b: Secondary | ICD-10-CM | POA: Diagnosis not present

## 2024-03-05 DIAGNOSIS — G9340 Encephalopathy, unspecified: Secondary | ICD-10-CM | POA: Diagnosis present

## 2024-03-05 DIAGNOSIS — I129 Hypertensive chronic kidney disease with stage 1 through stage 4 chronic kidney disease, or unspecified chronic kidney disease: Secondary | ICD-10-CM | POA: Diagnosis present

## 2024-03-05 DIAGNOSIS — Z880 Allergy status to penicillin: Secondary | ICD-10-CM

## 2024-03-05 DIAGNOSIS — R400 Somnolence: Secondary | ICD-10-CM | POA: Diagnosis not present

## 2024-03-05 DIAGNOSIS — E663 Overweight: Secondary | ICD-10-CM | POA: Diagnosis present

## 2024-03-05 DIAGNOSIS — F0394 Unspecified dementia, unspecified severity, with anxiety: Secondary | ICD-10-CM | POA: Diagnosis present

## 2024-03-05 DIAGNOSIS — Z9049 Acquired absence of other specified parts of digestive tract: Secondary | ICD-10-CM

## 2024-03-05 DIAGNOSIS — R55 Syncope and collapse: Secondary | ICD-10-CM | POA: Diagnosis not present

## 2024-03-05 DIAGNOSIS — Z79899 Other long term (current) drug therapy: Secondary | ICD-10-CM

## 2024-03-05 DIAGNOSIS — G9341 Metabolic encephalopathy: Secondary | ICD-10-CM | POA: Diagnosis present

## 2024-03-05 DIAGNOSIS — Z8249 Family history of ischemic heart disease and other diseases of the circulatory system: Secondary | ICD-10-CM

## 2024-03-05 DIAGNOSIS — N179 Acute kidney failure, unspecified: Secondary | ICD-10-CM | POA: Diagnosis present

## 2024-03-05 DIAGNOSIS — G9349 Other encephalopathy: Secondary | ICD-10-CM | POA: Diagnosis present

## 2024-03-05 HISTORY — DX: Hyperlipidemia, unspecified: E78.5

## 2024-03-05 HISTORY — DX: Anxiety disorder, unspecified: F41.9

## 2024-03-05 LAB — URINALYSIS, ROUTINE W REFLEX MICROSCOPIC
Bilirubin Urine: NEGATIVE
Glucose, UA: NEGATIVE mg/dL
Hgb urine dipstick: NEGATIVE
Ketones, ur: NEGATIVE mg/dL
Nitrite: NEGATIVE
Protein, ur: NEGATIVE mg/dL
Specific Gravity, Urine: 1.006 (ref 1.005–1.030)
pH: 7 (ref 5.0–8.0)

## 2024-03-05 LAB — CBC WITH DIFFERENTIAL/PLATELET
Abs Immature Granulocytes: 0.01 K/uL (ref 0.00–0.07)
Basophils Absolute: 0 K/uL (ref 0.0–0.1)
Basophils Relative: 1 %
Eosinophils Absolute: 0.2 K/uL (ref 0.0–0.5)
Eosinophils Relative: 4 %
HCT: 33.7 % — ABNORMAL LOW (ref 36.0–46.0)
Hemoglobin: 10.8 g/dL — ABNORMAL LOW (ref 12.0–15.0)
Immature Granulocytes: 0 %
Lymphocytes Relative: 31 %
Lymphs Abs: 1.4 K/uL (ref 0.7–4.0)
MCH: 29.9 pg (ref 26.0–34.0)
MCHC: 32 g/dL (ref 30.0–36.0)
MCV: 93.4 fL (ref 80.0–100.0)
Monocytes Absolute: 0.5 K/uL (ref 0.1–1.0)
Monocytes Relative: 11 %
Neutro Abs: 2.4 K/uL (ref 1.7–7.7)
Neutrophils Relative %: 53 %
Platelets: 304 K/uL (ref 150–400)
RBC: 3.61 MIL/uL — ABNORMAL LOW (ref 3.87–5.11)
RDW: 16.2 % — ABNORMAL HIGH (ref 11.5–15.5)
WBC: 4.4 K/uL (ref 4.0–10.5)
nRBC: 0 % (ref 0.0–0.2)

## 2024-03-05 LAB — COMPREHENSIVE METABOLIC PANEL WITH GFR
ALT: 16 U/L (ref 0–44)
AST: 22 U/L (ref 15–41)
Albumin: 3.4 g/dL — ABNORMAL LOW (ref 3.5–5.0)
Alkaline Phosphatase: 62 U/L (ref 38–126)
Anion gap: 10 (ref 5–15)
BUN: 24 mg/dL — ABNORMAL HIGH (ref 8–23)
CO2: 24 mmol/L (ref 22–32)
Calcium: 9.4 mg/dL (ref 8.9–10.3)
Chloride: 105 mmol/L (ref 98–111)
Creatinine, Ser: 1.41 mg/dL — ABNORMAL HIGH (ref 0.44–1.00)
GFR, Estimated: 37 mL/min — ABNORMAL LOW (ref >60)
Glucose, Bld: 88 mg/dL (ref 70–99)
Potassium: 4.3 mmol/L (ref 3.5–5.1)
Sodium: 139 mmol/L (ref 135–145)
Total Bilirubin: 0.6 mg/dL (ref 0.0–1.2)
Total Protein: 6.4 g/dL — ABNORMAL LOW (ref 6.5–8.1)

## 2024-03-05 LAB — MAGNESIUM: Magnesium: 2.1 mg/dL (ref 1.7–2.4)

## 2024-03-05 LAB — TROPONIN I (HIGH SENSITIVITY)
Troponin I (High Sensitivity): 8 ng/L (ref <18)
Troponin I (High Sensitivity): 8 ng/L (ref <18)

## 2024-03-05 LAB — RESP PANEL BY RT-PCR (RSV, FLU A&B, COVID)  RVPGX2
Influenza A by PCR: NEGATIVE
Influenza B by PCR: NEGATIVE
Resp Syncytial Virus by PCR: NEGATIVE
SARS Coronavirus 2 by RT PCR: NEGATIVE

## 2024-03-05 MED ORDER — SODIUM CHLORIDE 0.9 % IV SOLN
1.0000 g | Freq: Once | INTRAVENOUS | Status: AC
  Administered 2024-03-05: 1 g via INTRAVENOUS
  Filled 2024-03-05: qty 10

## 2024-03-05 MED ORDER — SODIUM CHLORIDE 0.9 % IV BOLUS
500.0000 mL | Freq: Once | INTRAVENOUS | Status: AC
  Administered 2024-03-05: 500 mL via INTRAVENOUS

## 2024-03-05 NOTE — ED Triage Notes (Signed)
 Pt bib [facility] coming from home. Pt reportedly had an unwitnessed fall in kitchen today. Pt's daughter heard her fall and believes she fell forward. Pt does have abrasion to nose. Pt's daughter does report pt has slurred speech after fall. EMS reports lethargy but no other neurological deficits at this time. On EMS arrival, pt heart in 40s, 1mg  atropine given. Pt alert and oriented x3 (disoriented to time, not baseline for pt). Pt not on blood thinners or beta blockers.   EMS VS: 58 HR 126/64 123 cbg 96% RA 16 RR

## 2024-03-05 NOTE — ED Notes (Signed)
 Difficulty obtaining lab work from patient. IV access lost. Phlebotomy asked to see patient for labs.

## 2024-03-05 NOTE — ED Notes (Signed)
Phlebotomy at bedside for labs.  

## 2024-03-05 NOTE — ED Provider Notes (Signed)
 [place] EMERGENCY DEPARTMENT AT [facility] Provider Note   CSN: [id number] Arrival date & time: 03/05/24  1519     Patient presents with: Fall and Bradycardia   Monique Snyder is a 83 y.o. female.   83 year old female presents today for concern of an unwitnessed fall in the kitchen today.  Since then her daughter reports that she has been slower to respond, confused and speaking some nonsensical things which is unusual for her.  Patient has history of baseline bradycardia which is not new.  She denies any chest pain, shortness of breath.  Patient is not on any blood thinners.  Denies any other complaints..  She was given 1 mg of atropine and route because her heart rate was in the 40s.  The history is provided by the patient. No language interpreter was used.       Prior to Admission medications   Medication Sig Start Date End Date Taking? Authorizing Provider  ALPRAZolam (XANAX) 0.5 MG tablet Take 0.25 mg by mouth every morning.   Yes [provider]  amLODipine  (NORVASC ) 10 MG tablet Take 10 mg by mouth daily.   Yes [provider]  spironolactone (ALDACTONE) 25 MG tablet Take 25 mg by mouth daily.   Yes [provider]  telmisartan (MICARDIS) 80 MG tablet Take 80 mg by mouth daily. 06/21/23  Yes [provider]  memantine  (NAMENDA ) 5 MG tablet Take 1 tablet (5 mg at night) for 2 weeks, then increase to 1 tablet (5 mg) twice a day Patient not taking: Reported on 03/05/2024 11/28/23   Wertman, Sara E, PA-C  olmesartan -hydrochlorothiazide (BENICAR  HCT) 40-12.5 MG tablet Take 1 tablet by mouth daily. Patient not taking: Reported on 03/05/2024 07/08/22   Jerilynn Lamarr HERO, NP    Allergies: Penicillins    Review of Systems  Unable to perform ROS: Acuity of condition  Constitutional:  Negative for chills.  Respiratory:  Negative for shortness of breath.   Cardiovascular:  Negative for chest pain.  Gastrointestinal:  Negative for  abdominal pain.    Updated Vital Signs BP 119/61   Pulse (!) 47   Temp (!) 97.5 F (36.4 C) (Oral)   Resp 16   Ht 5' 2 (1.575 m)   SpO2 98%   BMI 27.62 kg/m   Physical Exam Vitals and nursing note reviewed.  Constitutional:      General: She is not in acute distress.    Appearance: Normal appearance. She is not ill-appearing.  HENT:     Head: Normocephalic and atraumatic.     Nose: Nose normal.  Eyes:     Conjunctiva/sclera: Conjunctivae normal.  Cardiovascular:     Rate and Rhythm: Normal rate and regular rhythm.  Pulmonary:     Effort: Pulmonary effort is normal. No respiratory distress.  Abdominal:     Palpations: Abdomen is soft.  Musculoskeletal:        General: No deformity. Normal range of motion.     Cervical back: Normal range of motion.  Skin:    Findings: No rash.  Neurological:     Mental Status: She is alert.     Comments: Cranial nerves III through XII intact.  Tongue midline.  Smile symmetrical.  Normal speech although slow, no pronator drift.  Finger-to-nose normal.  Good range of motion bilateral upper and lower extremities with good strength.     (all labs ordered are listed, but only abnormal results are displayed) Labs Reviewed  CBC WITH DIFFERENTIAL/PLATELET - Abnormal;  Notable for the following components:      Result Value   RBC 3.61 (*)    Hemoglobin 10.8 (*)    HCT 33.7 (*)    RDW 16.2 (*)    All other components within normal limits  COMPREHENSIVE METABOLIC PANEL WITH GFR - Abnormal; Notable for the following components:   BUN 24 (*)    Creatinine, Ser 1.41 (*)    Total Protein 6.4 (*)    Albumin 3.4 (*)    GFR, Estimated 37 (*)    All other components within normal limits  URINALYSIS, ROUTINE W REFLEX MICROSCOPIC - Abnormal; Notable for the following components:   Color, Urine STRAW (*)    Leukocytes,Ua MODERATE (*)    Bacteria, UA RARE (*)    All other components within normal limits  RESP PANEL BY RT-PCR (RSV, FLU A&B, COVID)   RVPGX2  MAGNESIUM  TROPONIN I (HIGH SENSITIVITY)  TROPONIN I (HIGH SENSITIVITY)    EKG: EKG Interpretation Date/Time:  Tuesday March 05 2024 15:29:09 EDT Ventricular Rate:  61 PR Interval:  175 QRS Duration:  87 QT Interval:  423 QTC Calculation: 427 R Axis:   49  Text Interpretation: Sinus rhythm Repol abnrm suggests ischemia, anterolateral Borderline ST elevation, lateral leads Confirmed by Jerrol Agent ([id number]) on 03/05/2024 3:51:53 PM  Radiology: CT HEAD WO CONTRAST ( ) Result Date: 03/05/2024 CLINICAL DATA:  Mental status change, unknown cause; Polytrauma, blunt EXAM: CT HEAD WITHOUT CONTRAST CT CERVICAL SPINE WITHOUT CONTRAST TECHNIQUE: Multidetector CT imaging of the head and cervical spine was performed following the standard protocol without intravenous contrast. Multiplanar CT image reconstructions of the cervical spine were also generated. RADIATION DOSE REDUCTION: This exam was performed according to the departmental dose-optimization program which includes automated exposure control, adjustment of the mA and/or kV according to patient size and/or use of iterative reconstruction technique. COMPARISON:  MRI head 08/30/2023 FINDINGS: CT HEAD FINDINGS Brain: No evidence of large-territorial acute infarction. No parenchymal hemorrhage. No mass lesion. No extra-axial collection. No mass effect or midline shift. No hydrocephalus. Basilar cisterns are patent. Vascular: No hyperdense vessel. Skull: No acute fracture or focal lesion. Sinuses/Orbits: Paranasal sinuses and mastoid air cells are clear. The orbits are unremarkable. Other: None. CT CERVICAL SPINE FINDINGS Alignment: Normal. Skull base and vertebrae: Multilevel moderate degenerative change of the spine. No associated severe osseous neural foraminal or central canal stenosis. No acute fracture. No aggressive appearing focal osseous lesion or focal pathologic process. Soft tissues and spinal canal: No prevertebral fluid or swelling.  No visible canal hematoma. Upper chest: Unremarkable. Other: None. IMPRESSION: 1. No acute intracranial abnormality. 2. No acute displaced fracture or traumatic listhesis of the cervical spine. Electronically Signed   By: Morgane  Naveau M.D.   On: 03/05/2024 17:53   CT Cervical Spine Wo Contrast Result Date: 03/05/2024 CLINICAL DATA:  Mental status change, unknown cause; Polytrauma, blunt EXAM: CT HEAD WITHOUT CONTRAST CT CERVICAL SPINE WITHOUT CONTRAST TECHNIQUE: Multidetector CT imaging of the head and cervical spine was performed following the standard protocol without intravenous contrast. Multiplanar CT image reconstructions of the cervical spine were also generated. RADIATION DOSE REDUCTION: This exam was performed according to the departmental dose-optimization program which includes automated exposure control, adjustment of the mA and/or kV according to patient size and/or use of iterative reconstruction technique. COMPARISON:  MRI head 08/30/2023 FINDINGS: CT HEAD FINDINGS Brain: No evidence of large-territorial acute infarction. No parenchymal hemorrhage. No mass lesion. No extra-axial collection. No mass effect or midline shift. No  hydrocephalus. Basilar cisterns are patent. Vascular: No hyperdense vessel. Skull: No acute fracture or focal lesion. Sinuses/Orbits: Paranasal sinuses and mastoid air cells are clear. The orbits are unremarkable. Other: None. CT CERVICAL SPINE FINDINGS Alignment: Normal. Skull base and vertebrae: Multilevel moderate degenerative change of the spine. No associated severe osseous neural foraminal or central canal stenosis. No acute fracture. No aggressive appearing focal osseous lesion or focal pathologic process. Soft tissues and spinal canal: No prevertebral fluid or swelling. No visible canal hematoma. Upper chest: Unremarkable. Other: None. IMPRESSION: 1. No acute intracranial abnormality. 2. No acute displaced fracture or traumatic listhesis of the cervical spine.  Electronically Signed   By: Morgane  Naveau M.D.   On: 03/05/2024 17:53   DG Chest Port 1 View Result Date: 03/05/2024 CLINICAL DATA:  Altered mental status EXAM: PORTABLE CHEST 1 VIEW COMPARISON:  03/19/2007 FINDINGS: Borderline enlargement of the cardiopericardial silhouette without edema. Atherosclerotic calcification of the aortic arch. The lungs appear clear. Mild mid and lower thoracic spondylosis. IMPRESSION: 1. Borderline enlargement of the cardiopericardial silhouette, without edema. 2. Mild thoracic spondylosis. Electronically Signed   By: Ryan Salvage M.D.   On: 03/05/2024 17:07     Procedures   Medications Ordered in the ED  cefTRIAXone  (ROCEPHIN ) 1 g in sodium chloride  0.9 % 100 mL IVPB (0 g Intravenous Stopped 03/05/24 2118)  sodium chloride  0.9 % bolus 500 mL (0 mLs Intravenous Stopped 03/05/24 2117)                                    Medical Decision Making Amount and/or Complexity of Data Reviewed Labs: ordered. Radiology: ordered.  Risk Decision regarding hospitalization.   Medical Decision Making / ED Course   This patient presents to the ED for concern of fall, confusion, this involves an extensive number of treatment options, and is a complaint that carries with it a high risk of complications and morbidity.  The differential diagnosis includes UTI, pneumonia, acute intracranial injury  MDM: 83 year old female presents today from home for concern of confusion and unwitnessed fall. Daughter at bedside.  Confirms a story.  She does state that she has been saying some things since the fall that have not made sense. Will obtain broad workup. It appears from bradycardia standpoint that patient has history of this and has seen cardiology.  She has remained asymptomatic and walks 2 to 3 miles a day. Currently she is without any significant complaints. CBC without leukocytosis.  Mild anemia but not beyond her baseline.  CMP with creatinine of 1.41 which is slightly  elevated beyond her baseline otherwise without acute concern.  Troponin negative.  UA shows some indications of urinary tract infection.  Rocephin  given.  500 mL bolus of fluid given.  Respiratory panel negative. CT head and C-spine without acute intracranial or C-spine pathology.  Chest x-ray without acute cardiopulmonary process.  EKG without acute ischemic change.  Unable to get a hold of family. Was eventually to get a hold of Adrien who is listed in the chart.  It was listed incorrectly as [phone].  This was updated to [phone].  This phone number was listed as the patient's home phone.  When I called patient's emergency contact answered.  The other daughter that was with her earlier in the department her phone number is not listed.  This daughter had no idea that patient was in the hospital or what was going on.  She did provide me with her sister's phone number but it went to voicemail.  Given continued confusion will discuss with hospitalist for admission.  Discussed with hospitalist.  They will evaluate patient for admission.   Additional history obtained: -Additional history obtained from daughter -External records from outside source obtained and reviewed including: Chart review including previous notes, labs, imaging, consultation notes   Lab Tests: -I ordered, reviewed, and interpreted labs.   The pertinent results include:   Labs Reviewed  CBC WITH DIFFERENTIAL/PLATELET - Abnormal; Notable for the following components:      Result Value   RBC 3.61 (*)    Hemoglobin 10.8 (*)    HCT 33.7 (*)    RDW 16.2 (*)    All other components within normal limits  COMPREHENSIVE METABOLIC PANEL WITH GFR - Abnormal; Notable for the following components:   BUN 24 (*)    Creatinine, Ser 1.41 (*)    Total Protein 6.4 (*)    Albumin 3.4 (*)    GFR, Estimated 37 (*)    All other components within normal limits  URINALYSIS, ROUTINE W REFLEX MICROSCOPIC - Abnormal; Notable for the  following components:   Color, Urine STRAW (*)    Leukocytes,Ua MODERATE (*)    Bacteria, UA RARE (*)    All other components within normal limits  RESP PANEL BY RT-PCR (RSV, FLU A&B, COVID)  RVPGX2  MAGNESIUM  TROPONIN I (HIGH SENSITIVITY)  TROPONIN I (HIGH SENSITIVITY)      EKG  EKG Interpretation Date/Time:  Tuesday March 05 2024 15:29:09 EDT Ventricular Rate:  61 PR Interval:  175 QRS Duration:  87 QT Interval:  423 QTC Calculation: 427 R Axis:   49  Text Interpretation: Sinus rhythm Repol abnrm suggests ischemia, anterolateral Borderline ST elevation, lateral leads Confirmed by Jerrol Agent ([id number]) on 03/05/2024 3:51:53 PM         Imaging Studies ordered: I ordered imaging studies including CT head, CT C-spine, chest x-ray I independently visualized and interpreted imaging. I agree with the radiologist interpretation   Medicines ordered and prescription drug management: Meds ordered this encounter  Medications   cefTRIAXone  (ROCEPHIN ) 1 g in sodium chloride  0.9 % 100 mL IVPB    Antibiotic Indication::   UTI   sodium chloride  0.9 % bolus 500 mL    -I have reviewed the patients home medicines and have made adjustments as needed   Reevaluation: After the interventions noted above, I reevaluated the patient and found that they have :improved  Co morbidities that complicate the patient evaluation  Past Medical History:  Diagnosis Date   Adenocarcinoma (HCC)    Cecum mass    Herniated disc    Hypertension    Overweight(278.02)       Dispostion: Discussed with hospitalist.  They will evaluate patient for admission.  Final diagnoses:  Urinary tract infection without hematuria, site unspecified    ED Discharge Orders     None          Hildegard Loge, PA-C 03/05/24 2230    Jerrol Agent, MD 03/06/24 1347

## 2024-03-06 ENCOUNTER — Inpatient Hospital Stay (HOSPITAL_COMMUNITY)

## 2024-03-06 ENCOUNTER — Encounter (HOSPITAL_COMMUNITY): Payer: Self-pay | Admitting: Family Medicine

## 2024-03-06 DIAGNOSIS — R4781 Slurred speech: Secondary | ICD-10-CM | POA: Diagnosis not present

## 2024-03-06 DIAGNOSIS — Z79899 Other long term (current) drug therapy: Secondary | ICD-10-CM | POA: Diagnosis not present

## 2024-03-06 DIAGNOSIS — G9340 Encephalopathy, unspecified: Secondary | ICD-10-CM | POA: Diagnosis present

## 2024-03-06 DIAGNOSIS — Z880 Allergy status to penicillin: Secondary | ICD-10-CM | POA: Diagnosis not present

## 2024-03-06 DIAGNOSIS — Z1152 Encounter for screening for COVID-19: Secondary | ICD-10-CM | POA: Diagnosis not present

## 2024-03-06 DIAGNOSIS — R011 Cardiac murmur, unspecified: Secondary | ICD-10-CM

## 2024-03-06 DIAGNOSIS — E785 Hyperlipidemia, unspecified: Secondary | ICD-10-CM | POA: Diagnosis not present

## 2024-03-06 DIAGNOSIS — W19XXXA Unspecified fall, initial encounter: Secondary | ICD-10-CM | POA: Diagnosis present

## 2024-03-06 DIAGNOSIS — Z9049 Acquired absence of other specified parts of digestive tract: Secondary | ICD-10-CM | POA: Diagnosis not present

## 2024-03-06 DIAGNOSIS — N1832 Chronic kidney disease, stage 3b: Secondary | ICD-10-CM | POA: Diagnosis present

## 2024-03-06 DIAGNOSIS — Z8249 Family history of ischemic heart disease and other diseases of the circulatory system: Secondary | ICD-10-CM | POA: Diagnosis not present

## 2024-03-06 DIAGNOSIS — R001 Bradycardia, unspecified: Secondary | ICD-10-CM

## 2024-03-06 DIAGNOSIS — I10 Essential (primary) hypertension: Secondary | ICD-10-CM | POA: Diagnosis not present

## 2024-03-06 DIAGNOSIS — G9341 Metabolic encephalopathy: Secondary | ICD-10-CM | POA: Diagnosis not present

## 2024-03-06 DIAGNOSIS — F32A Depression, unspecified: Secondary | ICD-10-CM | POA: Diagnosis not present

## 2024-03-06 DIAGNOSIS — N179 Acute kidney failure, unspecified: Secondary | ICD-10-CM | POA: Diagnosis not present

## 2024-03-06 DIAGNOSIS — R55 Syncope and collapse: Secondary | ICD-10-CM | POA: Diagnosis not present

## 2024-03-06 DIAGNOSIS — F0393 Unspecified dementia, unspecified severity, with mood disturbance: Secondary | ICD-10-CM | POA: Diagnosis not present

## 2024-03-06 DIAGNOSIS — N390 Urinary tract infection, site not specified: Secondary | ICD-10-CM | POA: Diagnosis not present

## 2024-03-06 DIAGNOSIS — F0394 Unspecified dementia, unspecified severity, with anxiety: Secondary | ICD-10-CM | POA: Diagnosis not present

## 2024-03-06 DIAGNOSIS — I129 Hypertensive chronic kidney disease with stage 1 through stage 4 chronic kidney disease, or unspecified chronic kidney disease: Secondary | ICD-10-CM | POA: Diagnosis not present

## 2024-03-06 DIAGNOSIS — E663 Overweight: Secondary | ICD-10-CM | POA: Diagnosis not present

## 2024-03-06 DIAGNOSIS — Z8673 Personal history of transient ischemic attack (TIA), and cerebral infarction without residual deficits: Secondary | ICD-10-CM | POA: Diagnosis not present

## 2024-03-06 LAB — CBC
HCT: 32.5 % — ABNORMAL LOW (ref 36.0–46.0)
Hemoglobin: 10.7 g/dL — ABNORMAL LOW (ref 12.0–15.0)
MCH: 30.7 pg (ref 26.0–34.0)
MCHC: 32.9 g/dL (ref 30.0–36.0)
MCV: 93.4 fL (ref 80.0–100.0)
Platelets: 312 K/uL (ref 150–400)
RBC: 3.48 MIL/uL — ABNORMAL LOW (ref 3.87–5.11)
RDW: 16 % — ABNORMAL HIGH (ref 11.5–15.5)
WBC: 4.8 K/uL (ref 4.0–10.5)
nRBC: 0 % (ref 0.0–0.2)

## 2024-03-06 LAB — AMMONIA: Ammonia: 47 umol/L — ABNORMAL HIGH (ref 9–35)

## 2024-03-06 LAB — BASIC METABOLIC PANEL WITH GFR
Anion gap: 7 (ref 5–15)
BUN: 28 mg/dL — ABNORMAL HIGH (ref 8–23)
CO2: 24 mmol/L (ref 22–32)
Calcium: 9.2 mg/dL (ref 8.9–10.3)
Chloride: 108 mmol/L (ref 98–111)
Creatinine, Ser: 1.56 mg/dL — ABNORMAL HIGH (ref 0.44–1.00)
GFR, Estimated: 33 mL/min — ABNORMAL LOW (ref >60)
Glucose, Bld: 102 mg/dL — ABNORMAL HIGH (ref 70–99)
Potassium: 4.4 mmol/L (ref 3.5–5.1)
Sodium: 139 mmol/L (ref 135–145)

## 2024-03-06 LAB — RPR: RPR Ser Ql: NONREACTIVE

## 2024-03-06 LAB — ECHOCARDIOGRAM COMPLETE
Area-P 1/2: 3.42 cm2
Height: 62 in
S' Lateral: 2.5 cm
Weight: 2338.64 [oz_av]

## 2024-03-06 LAB — TSH: TSH: 1.178 u[IU]/mL (ref 0.350–4.500)

## 2024-03-06 LAB — VITAMIN B12: Vitamin B-12: 758 pg/mL (ref 180–914)

## 2024-03-06 MED ORDER — ONDANSETRON HCL 4 MG/2ML IJ SOLN: 4.0000 mg | Freq: Four times a day (QID) | INTRAMUSCULAR | Status: DC | PRN

## 2024-03-06 MED ORDER — MELATONIN 3 MG PO TABS
3.0000 mg | ORAL_TABLET | Freq: Every evening | ORAL | Status: DC | PRN
  Administered 2024-03-06: 3 mg via ORAL
  Filled 2024-03-06: qty 1

## 2024-03-06 MED ORDER — SODIUM CHLORIDE 0.9 % IV SOLN: INTRAVENOUS | Status: AC

## 2024-03-06 MED ORDER — ACETAMINOPHEN 325 MG PO TABS: 650.0000 mg | ORAL_TABLET | Freq: Four times a day (QID) | ORAL | Status: DC | PRN

## 2024-03-06 MED ORDER — HEPARIN SODIUM (PORCINE) 5000 UNIT/ML IJ SOLN
5000.0000 [IU] | Freq: Three times a day (TID) | INTRAMUSCULAR | Status: DC
  Administered 2024-03-06 – 2024-03-07 (×4): 5000 [IU] via SUBCUTANEOUS
  Filled 2024-03-06 (×4): qty 1

## 2024-03-06 MED ORDER — SODIUM CHLORIDE 0.9 % IV SOLN
1.0000 g | INTRAVENOUS | Status: DC
  Administered 2024-03-06: 1 g via INTRAVENOUS
  Filled 2024-03-06: qty 10

## 2024-03-06 MED ORDER — ACETAMINOPHEN 650 MG RE SUPP: 650.0000 mg | Freq: Four times a day (QID) | RECTAL | Status: DC | PRN

## 2024-03-06 MED ORDER — SODIUM CHLORIDE 0.9% FLUSH
3.0000 mL | Freq: Two times a day (BID) | INTRAVENOUS | Status: DC
  Administered 2024-03-06 – 2024-03-07 (×3): 3 mL via INTRAVENOUS

## 2024-03-06 MED ORDER — SENNA 8.6 MG PO TABS: 1.0000 | ORAL_TABLET | Freq: Every day | ORAL | Status: DC | PRN

## 2024-03-06 MED ORDER — AMLODIPINE BESYLATE 10 MG PO TABS
10.0000 mg | ORAL_TABLET | Freq: Every day | ORAL | Status: DC
  Administered 2024-03-06 – 2024-03-07 (×2): 10 mg via ORAL
  Filled 2024-03-06: qty 2
  Filled 2024-03-06: qty 1

## 2024-03-06 MED ORDER — ONDANSETRON HCL 4 MG PO TABS: 4.0000 mg | ORAL_TABLET | Freq: Four times a day (QID) | ORAL | Status: DC | PRN

## 2024-03-06 NOTE — ED Notes (Signed)
 Called 3E secretary to inform the patient would be coming up with transport.

## 2024-03-06 NOTE — Progress Notes (Signed)
  Echocardiogram 2D Echocardiogram has been performed.  Monique Snyder 03/06/2024, 3:23 PM

## 2024-03-06 NOTE — ED Notes (Signed)
 Upon entering the patient's room she was found to be standing at the sink with water all over the floor, her IV was removed, all cardiac monitoring was removed, pt is very confused not knowing she is in the hospital not understanding the importance of keeping the cardiac monitor on. Sent message to MD, charge RN aware.

## 2024-03-06 NOTE — ED Notes (Signed)
 CCMD called.

## 2024-03-06 NOTE — Progress Notes (Signed)
 PROGRESS NOTE    Monique Snyder  FMW:[id number] DOB: May 04, 1941 DOA: 03/05/2024 PCP: Claudene Pellet, MD   Brief Narrative:  This 83 yrs old female with PMH significant for hypertension, hyperlipidemia, CKD stage III, and recent dementia diagnosis per  family who presents to the ED with increased confusion and fall.She was seated at a table with her son-in-law when she appeared to fall asleep and fell to the ground. She seemed to have some slurred speech initially, her daughter was concerned for stroke, and therefore called EMS. She was lethargic with EMS, is said to have had heart rate in the 40s, and was given atropine prior to arrival in the ED. Patient is alert and interactive in the ED but She still has some confusion than usual as per daughter at bedside.  Workup in the ED reveals CT head and C-spine unremarkable.  UA positive for UTI.  Continue to have significant bradycardia in 40s and 50s.  Patient was admitted for further evaluation and started on IV hydration and IV Rocephin .  Assessment & Plan:   Principal Problem:   Acute encephalopathy Active Problems:   Hypertension   Memory impairment   CKD stage 3b, GFR 30-44 ml/min (HCC)   Acute encephalopathy : She presented with increased confusion with underlying dementia.  No acute head CT findings in ED  UA consistent with UTI. Started on empiric ceftriaxone  and urine culture sent. Ammonia level 47, slightly high, TSH, B12, and RPR, unremarkable.   Continue Rocephin  for possible UTI, Continue IVF hydration,  Hold Xanax, follow clinical course     Essential hypertension : Continue amlodipine  .   CKD IIIb: SCr is 1.41 in ED . Baseline unclear, was 1.3 in January, 1.6 in March, and 2.3 in June 2025  Renally-dose medications, monitor renal functions.   Dementia: Delirium precautions. Continue supportive care.   Anxiety Disorder: Hold Xanax for now given her increased confusion.  Bradycardia: Patient continues to have  bradycardia,  heart rate remains in 50s. EMS found her heart rate in 40s and has received atropine. Cardiology is consulted.   DVT prophylaxis: Heparin  sq Code Status: Full code Family Communication: No family at bed side. Disposition Plan:  Status is: Observation The patient remains OBS appropriate and will d/c before 2 midnights.   Admitted for altered mental status likely in the setting of UTI. Also found to have significant bradycardia ,  Cardiology is consulted.  Consultants:  Cardiology  Procedures: CT Head/ C spine  Antimicrobials:  Anti-infectives (From admission, onward)    Start     Dose/Rate Route Frequency Ordered Stop   03/06/24 2000  cefTRIAXone  (ROCEPHIN ) 1 g in sodium chloride  0.9 % 100 mL IVPB        1 g 200 mL/hr over 30 Minutes Intravenous Every 24 hours 03/06/24 0156     03/05/24 1945  cefTRIAXone  (ROCEPHIN ) 1 g in sodium chloride  0.9 % 100 mL IVPB        1 g 200 mL/hr over 30 Minutes Intravenous  Once 03/05/24 1941 03/05/24 2118      Subjective: Patient was seen and examined at bedside.  Overnight events noted. Patient seems slightly confused but following commands.   Lying comfortably in the bed,  denies any hallucinations,  delusions.  Objective: Vitals:   03/06/24 0700 03/06/24 0715 03/06/24 0915 03/06/24 0924  BP:    130/68  Pulse: (!) 44 (!) 47  (!) 53  Resp:   19 14  Temp:    98.1 F (36.7 C)  TempSrc:    Oral  SpO2: 99% 99%  99%  Height:        Intake/Output Summary (Last 24 hours) at 03/06/2024 1123 Last data filed at 03/05/2024 2118 Gross per 24 hour  Intake 600 ml  Output --  Net 600 ml   There were no vitals filed for this visit.  Examination:  General exam: Appears calm and comfortable, not in any acute distress. Respiratory system: CTA Bilaterally . Respiratory effort normal.  RR 14 Cardiovascular system: S1 & S2 heard, RRR. No JVD, murmurs, rubs, gallops or clicks.  Gastrointestinal system: Abdomen is non distended,  soft and non tender. Normal bowel sounds heard. Central nervous system: Alert and oriented x 1. No focal neurological deficits. Extremities: No edema, no cyanosis, no clubbing Skin: No rashes, lesions or ulcers Psychiatry: Mood & affect appropriate.     Data Reviewed: I have personally reviewed following labs and imaging studies  CBC: Recent Labs  Lab 03/05/24 1554 03/06/24 0450  WBC 4.4 4.8  NEUTROABS 2.4  --   HGB 10.8* 10.7*  HCT 33.7* 32.5*  MCV 93.4 93.4  PLT 304 312   Basic Metabolic Panel: Recent Labs  Lab 03/05/24 1554 03/06/24 0450  NA 139 139  K 4.3 4.4  CL 105 108  CO2 24 24  GLUCOSE 88 102*  BUN 24* 28*  CREATININE 1.41* 1.56*  CALCIUM 9.4 9.2  MG 2.1  --    GFR: CrCl cannot be calculated (Unknown ideal weight.). Liver Function Tests: Recent Labs  Lab 03/05/24 1554  AST 22  ALT 16  ALKPHOS 62  BILITOT 0.6  PROT 6.4*  ALBUMIN 3.4*   No results for input(s): LIPASE, AMYLASE in the last 168 hours. Recent Labs  Lab 03/06/24 0450  AMMONIA 47*   Coagulation Profile: No results for input(s): INR, PROTIME in the last 168 hours. Cardiac Enzymes: No results for input(s): CKTOTAL, CKMB, CKMBINDEX, TROPONINI in the last 168 hours. BNP (last 3 results) No results for input(s): PROBNP in the last 8760 hours. HbA1C: No results for input(s): HGBA1C in the last 72 hours. CBG: No results for input(s): GLUCAP in the last 168 hours. Lipid Profile: No results for input(s): CHOL, HDL, LDLCALC, TRIG, CHOLHDL, LDLDIRECT in the last 72 hours. Thyroid  Function Tests: Recent Labs    03/06/24 0450  TSH 1.178   Anemia Panel: Recent Labs    03/06/24 0450  VITAMINB12 758   Sepsis Labs: No results for input(s): PROCALCITON, LATICACIDVEN in the last 168 hours.  Recent Results (from the past 240 hours)  Resp panel by RT-PCR (RSV, Flu A&B, Covid) Anterior Nasal Swab     Status: None   Collection Time: 03/05/24  5:48  PM   Specimen: Anterior Nasal Swab  Result Value Ref Range Status   SARS Coronavirus 2 by RT PCR NEGATIVE NEGATIVE Final   Influenza A by PCR NEGATIVE NEGATIVE Final   Influenza B by PCR NEGATIVE NEGATIVE Final    Comment: (NOTE) The Xpert Xpress SARS-CoV-2/FLU/RSV plus assay is intended as an aid in the diagnosis of influenza from Nasopharyngeal swab specimens and should not be used as a sole basis for treatment. Nasal washings and aspirates are unacceptable for Xpert Xpress SARS-CoV-2/FLU/RSV testing.  Fact Sheet for Patients: BloggerCourse.com  Fact Sheet for Healthcare Providers: SeriousBroker.it  This test is not yet approved or cleared by the [place]  FDA and has been authorized for detection and/or diagnosis of SARS-CoV-2 by FDA under an Emergency Use Authorization (EUA). This EUA  will remain in effect (meaning this test can be used) for the duration of the COVID-19 declaration under Section 564(b)(1) of the Act, 21 U.S.C. section 360bbb-3(b)(1), unless the authorization is terminated or revoked.     Resp Syncytial Virus by PCR NEGATIVE NEGATIVE Final    Comment: (NOTE) Fact Sheet for Patients: BloggerCourse.com  Fact Sheet for Healthcare Providers: SeriousBroker.it  This test is not yet approved or cleared by the [place]  FDA and has been authorized for detection and/or diagnosis of SARS-CoV-2 by FDA under an Emergency Use Authorization (EUA). This EUA will remain in effect (meaning this test can be used) for the duration of the COVID-19 declaration under Section 564(b)(1) of the Act, 21 U.S.C. section 360bbb-3(b)(1), unless the authorization is terminated or revoked.  Performed at [facility], [address] [place], [place] [address]     Radiology Studies: CT HEAD WO CONTRAST ( ) Result Date: 03/05/2024 CLINICAL DATA:  Mental status change,  unknown cause; Polytrauma, blunt EXAM: CT HEAD WITHOUT CONTRAST CT CERVICAL SPINE WITHOUT CONTRAST TECHNIQUE: Multidetector CT imaging of the head and cervical spine was performed following the standard protocol without intravenous contrast. Multiplanar CT image reconstructions of the cervical spine were also generated. RADIATION DOSE REDUCTION: This exam was performed according to the departmental dose-optimization program which includes automated exposure control, adjustment of the mA and/or kV according to patient size and/or use of iterative reconstruction technique. COMPARISON:  MRI head 08/30/2023 FINDINGS: CT HEAD FINDINGS Brain: No evidence of large-territorial acute infarction. No parenchymal hemorrhage. No mass lesion. No extra-axial collection. No mass effect or midline shift. No hydrocephalus. Basilar cisterns are patent. Vascular: No hyperdense vessel. Skull: No acute fracture or focal lesion. Sinuses/Orbits: Paranasal sinuses and mastoid air cells are clear. The orbits are unremarkable. Other: None. CT CERVICAL SPINE FINDINGS Alignment: Normal. Skull base and vertebrae: Multilevel moderate degenerative change of the spine. No associated severe osseous neural foraminal or central canal stenosis. No acute fracture. No aggressive appearing focal osseous lesion or focal pathologic process. Soft tissues and spinal canal: No prevertebral fluid or swelling. No visible canal hematoma. Upper chest: Unremarkable. Other: None. IMPRESSION: 1. No acute intracranial abnormality. 2. No acute displaced fracture or traumatic listhesis of the cervical spine. Electronically Signed   By: Morgane  Naveau M.D.   On: 03/05/2024 17:53   CT Cervical Spine Wo Contrast Result Date: 03/05/2024 CLINICAL DATA:  Mental status change, unknown cause; Polytrauma, blunt EXAM: CT HEAD WITHOUT CONTRAST CT CERVICAL SPINE WITHOUT CONTRAST TECHNIQUE: Multidetector CT imaging of the head and cervical spine was performed following the  standard protocol without intravenous contrast. Multiplanar CT image reconstructions of the cervical spine were also generated. RADIATION DOSE REDUCTION: This exam was performed according to the departmental dose-optimization program which includes automated exposure control, adjustment of the mA and/or kV according to patient size and/or use of iterative reconstruction technique. COMPARISON:  MRI head 08/30/2023 FINDINGS: CT HEAD FINDINGS Brain: No evidence of large-territorial acute infarction. No parenchymal hemorrhage. No mass lesion. No extra-axial collection. No mass effect or midline shift. No hydrocephalus. Basilar cisterns are patent. Vascular: No hyperdense vessel. Skull: No acute fracture or focal lesion. Sinuses/Orbits: Paranasal sinuses and mastoid air cells are clear. The orbits are unremarkable. Other: None. CT CERVICAL SPINE FINDINGS Alignment: Normal. Skull base and vertebrae: Multilevel moderate degenerative change of the spine. No associated severe osseous neural foraminal or central canal stenosis. No acute fracture. No aggressive appearing focal osseous lesion or focal pathologic process. Soft tissues and spinal  canal: No prevertebral fluid or swelling. No visible canal hematoma. Upper chest: Unremarkable. Other: None. IMPRESSION: 1. No acute intracranial abnormality. 2. No acute displaced fracture or traumatic listhesis of the cervical spine. Electronically Signed   By: Morgane  Naveau M.D.   On: 03/05/2024 17:53   DG Chest Port 1 View Result Date: 03/05/2024 CLINICAL DATA:  Altered mental status EXAM: PORTABLE CHEST 1 VIEW COMPARISON:  03/19/2007 FINDINGS: Borderline enlargement of the cardiopericardial silhouette without edema. Atherosclerotic calcification of the aortic arch. The lungs appear clear. Mild mid and lower thoracic spondylosis. IMPRESSION: 1. Borderline enlargement of the cardiopericardial silhouette, without edema. 2. Mild thoracic spondylosis. Electronically Signed   By:  Ryan Salvage M.D.   On: 03/05/2024 17:07   Scheduled Meds:  amLODipine   10 mg Oral Daily   heparin   5,000 Units Subcutaneous Q8H   sodium chloride  flush  3 mL Intravenous Q12H   Continuous Infusions:  cefTRIAXone  (ROCEPHIN )  IV       LOS: 0 days    Time spent: 50 mins    Darcel Dawley, MD [facility]   If 7PM-7AM, please contact night-coverage

## 2024-03-06 NOTE — ED Notes (Signed)
 Pt wandering around room and plundering in things. Pt also will not keep her monitor on and took out her IV. Dr.Opyd notified

## 2024-03-06 NOTE — TOC CM/SW Note (Signed)
 Transition of Care [facility]) - Inpatient Brief Assessment   Patient Details  Name: Monique Snyder MRN: [id number] Date of Birth: January 24, 1941  [facility] CM/SW Contact:    Waddell Barnie Rama, RN Phone Number: 03/06/2024, 4:30 PM   Clinical Narrative: From home with daughter Slater Norris, patient has new apartment at [facility] now and will be going there at dc, has PCP and insurance on file, states has no HH services in place at this time or DME at home.  States family memberETTER Slater Norris)  will transport them home at [employer] and Slater is support system, states gets medications from [place] on [address].  Pta self ambulatory.   There are no ICM needs identified  at this time.  Please place consult for ICM needs.     Transition of Care Asessment: Insurance and Status: Insurance coverage has been reviewed Patient has primary care physician: Yes Home environment has been reviewed: home with daughter , Slater Norris Prior level of function:: indep Prior/Current Home Services: No current home services Social Drivers of Health Review: SDOH reviewed no interventions necessary Readmission risk has been reviewed: Yes Transition of care needs: no transition of care needs at this time

## 2024-03-06 NOTE — ED Notes (Signed)
 Patient came out of room and stated that she was in the wrong room.  Patient re-oriented to room and assisted back to bed.  Patient continues to refuse to wear monitoring equipment stating that she doesn't need it.  Patient educated on need for monitoring equipment and patient states I'm fine.

## 2024-03-06 NOTE — Plan of Care (Signed)

## 2024-03-06 NOTE — Consult Note (Addendum)
 Cardiology Consultation   Patient ID: Monique Snyder MRN: [id number]; DOB: 03/20/1941  Admit date: 03/05/2024 Date of Consult: 03/06/2024  PCP:  Claudene Pellet, MD    [facility] Cardiologist:  Darryle ONEIDA Decent, MD      Patient Profile: Monique Snyder is a 83 y.o. female with a hx of bradycardia, hypertension, CKD stage IIIb, moderate LVH, depression, and anxiety who is being seen 03/06/2024 for the evaluation of bradycardia at the request of Darcel Dawley MD.  History of Present Illness: Monique Snyder is an 83 year old female with prior cardiac history listed below.  Was seen by Dr. Vernice for bradycardia on 06/2020.  EKG showed sinus bradycardia and LVH with a repolarization abnormality.  Because of this an echocardiogram was Snyder that showed a normal LVEF of 60 to 65%, moderate concentric LVH, G1 DD, mild atrial dilation, trivial MR, and respiratory variability and IVC.  At last visit on 06/2022 patient was overwhelmed caring for her daughter who had a stroke.  She had slightly elevated blood pressure at times but was generally doing well from a cardiac perspective.  Patient was having problems with memory and was seeing a neurologist.  Was diagnosed with dementia that is concerning for Alzheimer's  Patient presented to the emergency department via EMS services for increased confusion and a recent fall.  Patient was seated with her son-in-law at the table when she fell asleep and fell to the ground.  Her daughter was concerned because the patient had some slurred speech and called emergency services.  When EMS arrived the patient was lethargic and had sinus bradycardia with rates that ranged from 42-49, and a first-degree AV block.  En route to the emergency department the patient was given atropine.  On interview the patient was alert and orientated 2/4.  She knew her name and date of birth but thought the year was 36 and did not have any recall of falling  or any recent collapse. Reported she was in the hospital because of concerns her PCP has about her memory.  During the interview the patient asked my name multiple times and could not recall the name of her favorite alcoholic drink.  Patient denied any nausea, vomiting, fever, chills, chest pain, shortness of breath, and lower extremity edema.  Reported that she walks about 1.5 miles twice a week.  Denies any chest pain or shortness of breath when doing this.   Patient reported that Slater is her POA and that is no longer Information systems manager.  I see similar documentation in the chart.  There is no contact information for Slater in the chart.  Labs showed potassium of 4.4, magnesium of 2.1, normal TSH of 1.178, increased ammonia 47, normal LFTs, negative high-sensitivity troponins 8> 8, elevated creatinine of 1.56 this appears to be about the patient's baseline, Negative respiratory panel, and normocytic anemia with a hemoglobin of 10.7.  Head CT showed no acute abnormality.  EKG showed normal sinus rhythm with a heart rate of 61.  Inferior and lateral T wave inversions seen on prior EKGs  Chest x-ray showed borderline enlargement of the cardiopericardial silhouette without edema.   Past Medical History:  Diagnosis Date   Adenocarcinoma (HCC)    Anxiety    Cecum mass    CKD stage 3b, GFR 30-44 ml/min (HCC) 03/06/2024   Herniated disc    HLD (hyperlipidemia)    Hypertension    Overweight(278.02)     Past Surgical History:  Procedure Laterality Date   COLECTOMY  Home Medications:  Prior to Admission medications   Medication Sig Start Date End Date Taking? Authorizing Provider  ALPRAZolam (XANAX) 0.5 MG tablet Take 0.25 mg by mouth every morning.   Yes [provider]  amLODipine  (NORVASC ) 10 MG tablet Take 10 mg by mouth daily.   Yes [provider]  spironolactone (ALDACTONE) 25 MG tablet Take 25 mg by mouth daily.   Yes [provider]  telmisartan (MICARDIS) 80 MG  tablet Take 80 mg by mouth daily. 06/21/23  Yes [provider]  memantine  (NAMENDA ) 5 MG tablet Take 1 tablet (5 mg at night) for 2 weeks, then increase to 1 tablet (5 mg) twice a day Patient not taking: Reported on 03/05/2024 11/28/23   Wertman, Sara E, PA-C  olmesartan -hydrochlorothiazide (BENICAR  HCT) 40-25 MG tablet Take 1 tablet by mouth daily. Patient not taking: Reported on 03/05/2024    [provider]    Scheduled Meds:  amLODipine   10 mg Oral Daily   heparin   5,000 Units Subcutaneous Q8H   sodium chloride  flush  3 mL Intravenous Q12H   Continuous Infusions:  cefTRIAXone  (ROCEPHIN )  IV     PRN Meds: acetaminophen  **OR** acetaminophen , melatonin, ondansetron  **OR** ondansetron  (ZOFRAN ) IV, senna  Allergies:    Allergies  Allergen Reactions   Penicillins Hives and Rash    Social History:   Social History   Socioeconomic History   Marital status: Widowed    Spouse name: Not on file   Number of children: 2   Years of education: 35   Highest education level: Not on file  Occupational History   Occupation: retired  Tobacco Use   Smoking status: Never   Smokeless tobacco: Never  Substance and Sexual Activity   Alcohol use: No   Drug use: No   Sexual activity: Not on file  Other Topics Concern   Not on file  Social History Narrative   Right handed   Drinks caffeine prn   Lives with her daughter home   2 girls   One level home   Social Drivers of Corporate investment banker Strain: Not on file  Food Insecurity: No Food Insecurity (03/06/2024)   Hunger Vital Sign    Worried About Running Out of Food in the Last Year: Never true    Ran Out of Food in the Last Year: Never true  Transportation Needs: Not on file  Physical Activity: Not on file  Stress: Not on file  Social Connections: Not on file  Intimate Partner Violence: Not on file    Family History:    Family History  Problem Relation Age of Onset   Heart disease Father    Heart  disease Brother      ROS:  Please see the history of present illness.   All other ROS reviewed and negative.     Physical Exam/Data: Vitals:   03/06/24 0715 03/06/24 0915 03/06/24 0924 03/06/24 1123  BP:   130/68 (!) 159/84  Pulse: (!) 47  (!) 53 63  Resp:  19 14 18   Temp:   98.1 F (36.7 C) 98.2 F (36.8 C)  TempSrc:   Oral Oral  SpO2: 99%  99% 100%  Weight:    66.3 kg  Height:    5' 2 (1.575 m)    Intake/Output Summary (Last 24 hours) at 03/06/2024 1255 Last data filed at 03/06/2024 1143 Gross per 24 hour  Intake 600 ml  Output 400 ml  Net 200 ml  03/06/2024   11:23 AM 11/28/2023    7:51 AM 07/08/2022   11:03 AM  Last 3 Weights  Weight (lbs) 146 lb 2.6 oz 151 lb 165 lb 12.8 oz  Weight (kg) 66.3 kg 68.493 kg 75.206 kg     Body mass index is 26.73 kg/m.  General:  Well nourished, well developed, in no acute distress. Appearing about stated age.  Alert and orientated 2 out of 4.  On room air. HEENT: normal Neck: no JVD Vascular: No carotid bruits; Distal pulses 2+ bilaterally Cardiac:  normal S1, S2; RRR; 2 out of 6 systolic murmur on right upper sternal border Lungs:  clear to auscultation bilaterally, no wheezing, rhonchi or rales  Abd: soft, nontender, no hepatomegaly  Ext: no edema Musculoskeletal:  No deformities, BUE and BLE strength normal and equal Skin: warm and dry  Neuro:  no focal abnormalities noted Psych:  Normal affect   EKG:  The EKG was personally reviewed and demonstrates:  normal sinus rhythm with a heart rate of 61.  Inferior and lateral T wave inversions seen on prior EKGs Telemetry:  Telemetry was personally reviewed and demonstrates: Sinus bradycardia with heart rates in the 50s to 60s  Relevant CV Studies: Echo pending  Laboratory Data: High Sensitivity Troponin:   Recent Labs  Lab 03/05/24 1554 03/05/24 2007  TROPONINIHS 8 8     Chemistry Recent Labs  Lab 03/05/24 1554 03/06/24 0450  NA 139 139  K 4.3 4.4  CL 105 108   CO2 24 24  GLUCOSE 88 102*  BUN 24* 28*  CREATININE 1.41* 1.56*  CALCIUM 9.4 9.2  MG 2.1  --   GFRNONAA 37* 33*  ANIONGAP 10 7    Recent Labs  Lab 03/05/24 1554  PROT 6.4*  ALBUMIN 3.4*  AST 22  ALT 16  ALKPHOS 62  BILITOT 0.6   Lipids No results for input(s): CHOL, TRIG, HDL, LABVLDL, LDLCALC, CHOLHDL in the last 168 hours.  Hematology Recent Labs  Lab 03/05/24 1554 03/06/24 0450  WBC 4.4 4.8  RBC 3.61* 3.48*  HGB 10.8* 10.7*  HCT 33.7* 32.5*  MCV 93.4 93.4  MCH 29.9 30.7  MCHC 32.0 32.9  RDW 16.2* 16.0*  PLT 304 312   Thyroid   Recent Labs  Lab 03/06/24 0450  TSH 1.178    BNPNo results for input(s): BNP, PROBNP in the last 168 hours.  DDimer No results for input(s): DDIMER in the last 168 hours.  Radiology/Studies:  CT HEAD WO CONTRAST ( ) Result Date: 03/05/2024 CLINICAL DATA:  Mental status change, unknown cause; Polytrauma, blunt EXAM: CT HEAD WITHOUT CONTRAST CT CERVICAL SPINE WITHOUT CONTRAST TECHNIQUE: Multidetector CT imaging of the head and cervical spine was performed following the standard protocol without intravenous contrast. Multiplanar CT image reconstructions of the cervical spine were also generated. RADIATION DOSE REDUCTION: This exam was performed according to the departmental dose-optimization program which includes automated exposure control, adjustment of the mA and/or kV according to patient size and/or use of iterative reconstruction technique. COMPARISON:  MRI head 08/30/2023 FINDINGS: CT HEAD FINDINGS Brain: No evidence of large-territorial acute infarction. No parenchymal hemorrhage. No mass lesion. No extra-axial collection. No mass effect or midline shift. No hydrocephalus. Basilar cisterns are patent. Vascular: No hyperdense vessel. Skull: No acute fracture or focal lesion. Sinuses/Orbits: Paranasal sinuses and mastoid air cells are clear. The orbits are unremarkable. Other: None. CT CERVICAL SPINE FINDINGS Alignment:  Normal. Skull base and vertebrae: Multilevel moderate degenerative change of the spine. No associated  severe osseous neural foraminal or central canal stenosis. No acute fracture. No aggressive appearing focal osseous lesion or focal pathologic process. Soft tissues and spinal canal: No prevertebral fluid or swelling. No visible canal hematoma. Upper chest: Unremarkable. Other: None. IMPRESSION: 1. No acute intracranial abnormality. 2. No acute displaced fracture or traumatic listhesis of the cervical spine. Electronically Signed   By: Morgane  Naveau M.D.   On: 03/05/2024 17:53   CT Cervical Spine Wo Contrast Result Date: 03/05/2024 CLINICAL DATA:  Mental status change, unknown cause; Polytrauma, blunt EXAM: CT HEAD WITHOUT CONTRAST CT CERVICAL SPINE WITHOUT CONTRAST TECHNIQUE: Multidetector CT imaging of the head and cervical spine was performed following the standard protocol without intravenous contrast. Multiplanar CT image reconstructions of the cervical spine were also generated. RADIATION DOSE REDUCTION: This exam was performed according to the departmental dose-optimization program which includes automated exposure control, adjustment of the mA and/or kV according to patient size and/or use of iterative reconstruction technique. COMPARISON:  MRI head 08/30/2023 FINDINGS: CT HEAD FINDINGS Brain: No evidence of large-territorial acute infarction. No parenchymal hemorrhage. No mass lesion. No extra-axial collection. No mass effect or midline shift. No hydrocephalus. Basilar cisterns are patent. Vascular: No hyperdense vessel. Skull: No acute fracture or focal lesion. Sinuses/Orbits: Paranasal sinuses and mastoid air cells are clear. The orbits are unremarkable. Other: None. CT CERVICAL SPINE FINDINGS Alignment: Normal. Skull base and vertebrae: Multilevel moderate degenerative change of the spine. No associated severe osseous neural foraminal or central canal stenosis. No acute fracture. No aggressive  appearing focal osseous lesion or focal pathologic process. Soft tissues and spinal canal: No prevertebral fluid or swelling. No visible canal hematoma. Upper chest: Unremarkable. Other: None. IMPRESSION: 1. No acute intracranial abnormality. 2. No acute displaced fracture or traumatic listhesis of the cervical spine. Electronically Signed   By: Morgane  Naveau M.D.   On: 03/05/2024 17:53   DG Chest Port 1 View Result Date: 03/05/2024 CLINICAL DATA:  Altered mental status EXAM: PORTABLE CHEST 1 VIEW COMPARISON:  03/19/2007 FINDINGS: Borderline enlargement of the cardiopericardial silhouette without edema. Atherosclerotic calcification of the aortic arch. The lungs appear clear. Mild mid and lower thoracic spondylosis. IMPRESSION: 1. Borderline enlargement of the cardiopericardial silhouette, without edema. 2. Mild thoracic spondylosis. Electronically Signed   By: Ryan Salvage M.D.   On: 03/05/2024 17:07     Assessment and Plan:  Monique Snyder is a 83 y.o. female with a hx of bradycardia, hypertension, CKD stage IIIb, moderate LVH, depression, and anxiety who is being seen 03/06/2024 for the evaluation of bradycardia at the request of Darcel Dawley MD.  Sinus bradycardia Fall from a seated position At prior visits patient was noted to have bradycardia When EMS arrived following a fall from a seated position the patient was lethargic and had sinus bradycardia with a heart rate that ranged from 42-49 beats per minute.  En route to the emergency department the patient was given atropine. - No acute changes were seen on head CT.   - During interview patient did not recall having any recent falls.  I am unable to assess her symptoms since she does not recall having a fall. - EKG showed normal sinus rhythm with a heart rate of 61.  Inferior and lateral T wave inversions seen on prior EKGs.  These may be repolarization abnormalities from LVH. - Heart rates in telemetry have ranged from the 50s to  60s.  Her lowest heart rate was about 49. - Currently does not meet guidelines for a  pacemaker and confusion is a likely cause of the fall.  Order echocardiogram. Reasonable to consider a cardiac monitor.   LVH Hypertension Systolic murmur Most recent echocardiogram on 07/2020 showed a normal LVEF of 60 to 65%, moderate concentric LVH, G1 DD, mild atrial dilation, trivial MR, and respiratory variability and IVC.  It was previously felt like the patient's hypertension may be contributing to the LVH.  In the hospital her blood pressures have been labile but have generally been slightly elevated.  Will avoid strict blood pressure management with concerns of a recent fall. 2 out of 6 systolic murmur heard on physical exam Order echocardiogram Continue amlodipine  10 milligrams daily   Dementia Anxiety and depression Patient was having problems with memory and was seeing a neurologist.  Was diagnosed with dementia that is concerning for Alzheimer's.  Patient was hospitalized for worsening confusion.  On interview patient thought the year was 65 and does not recall having any recent falls.  She also could not recall the name of her favorite alcoholic drink.  Patient is overwhelmed from being the primary caregiver for her oldest daughter following a stroke.  She feels like some of the stress may be contributing to her memory problems.  Patient is a poor historian.  Patient reported that Slater is her POA and that is no longer Information systems manager.  I see similar documentation about this in the chart.  There is no contact information for Slater in the chart.    UTI UA was positive for a UTI Patient is being treated with IV ceftriaxone  Management per primary   Otherwise managed per primary   Risk Assessment/Risk Scores:   For questions or updates, please contact [facility] Please consult [email or web address] for contact info under    Signed, Morse Clause, PA-C  03/06/2024 12:55 PM   I have  personally seen and examined the patient.  My HPI, Exam, and assessment and plan are below, independent of the NPP above.  Monique Snyder with sinus bradycardia and left ventricular hypertrophy, presents with memory problems and a recent fall.  She has a history of sinus bradycardia with heart rates ranging from the forties to sixties and left ventricular hypertrophy (LVH) with a repolarization abnormality on EKG. An echocardiogram showed moderate concentric LVH. Her blood pressure has been elevated, and she is currently on amlodipine  with blood pressure readings mostly controlled under 140/80.  Recently, she has been experiencing memory problems and was diagnosed with dementia by a neurologist, with concerns for Alzheimer's disease. Imaging of her head showed microvascular changes. She presented to the emergency department following a fall where she collapsed while seated at a table, accompanied by increased confusion. Her daughter was concerned about a possible stroke per report (sitter is present- no family here at my eval)  During the review of symptoms, she was able to recall her name and [facility] location but incorrectly stated the year as 2023. She believes stress from caring for her daughter, who had a stroke, might be affecting her memory.  She walks regularly, although her routine was disrupted due to caring for her daughter. She drives and manages household tasks, including grocery shopping and cooking- this is per her report.  This is not congruous with other history and appears to represent several years ago.  Exam notable for  Gen: no distress  Neck: No JVD Cardiac: No Rubs or Gallops, systolic Murmur, RRR +2 radial pulses Respiratory: Clear to auscultation bilaterally, normal effort, normal  respiratory rate GI: Soft, nontender, non-distended  MS: No  edema;  moves all extremities Integument: Skin feels warm Neuro:  At time of evaluation, alert and oriented to person/place/ Psych:  Normal affect, patient feels fine   RADIOLOGY Head imaging: microvascular changes  DIAGNOSTIC EKG: sinus bradycardia, LVH, repolarization abnormality, i no first degree AV block biatrial enlargemeng Echocardiogram: moderate concentric LVH, normal biventricular function; no AS, mild MR and TR from view performed in the room; full study is pending CT: no acute abnormalities TELE: Rates 40s, no high grade AV block  In assessment and plan:   Syncope and collapse Recent syncope episode while seated, with increased confusion. Differential includes cardiac causes, Alzheimer's dementia, and acute worsening due to urinary tract infection. CT showed no acute abnormalities. EKG showed inferior lateral T wave inversion, consistent with left ventricular hypertrophy. No first degree AV block. Telemetry showed heart rates in the forties to sixties. No pacemaker indication. Alzheimer's dementia and urinary tract infection may contribute to syncope and confusion. - Consider outpatient non-live heart monitor if echocardiogram and telemetry are unremarkable.  Alzheimer's dementia Diagnosed with dementia, concerns for Alzheimer's. Recent memory problems and increased confusion. Stress from caregiving may contribute to cognitive decline. Differential includes Alzheimer's dementia and acute worsening due to urinary tract infection. Alzheimer's dementia may contribute to syncope and confusion. - Needs follow-up with neurology for further evaluation and management of dementia.   Sinus bradycardia Sinus bradycardia with heart rates in the forties to sixties. No pacemaker indication due to criteria and Alzheimer's dementia. Bradycardia not considered primary cause of syncope.  Left ventricular hypertrophy with hypertension Moderate concentric left ventricular hypertrophy on echocardiogram, likely secondary to long-standing hypertension. Blood pressures labile but mostly controlled under 140/80. No EKG changes  compared to previous studies. Continuing antihypertensive regimen with amlodipine . Left ventricular hypertrophy consistent with hypertension. - Continue amlodipine  for blood pressure management. - some signs are suggestive of ATTR-CA; asymptomatic and with dementia hx may not need additional testing; will defer to primary cardiologist  Stanly Leavens, MD FASE [facility] Cardiologist [facility]  [facility]  [address] [place], [address] [place], [place] [address] [phone]  3:36 PM

## 2024-03-06 NOTE — ED Notes (Signed)
 Patient does not want to stay in bed, continues to wander around room, coming out of room and talking to staff.  Patient redirected back to bed.

## 2024-03-06 NOTE — H&P (Signed)
 History and Physical    Monique Snyder FMW:[id number] DOB: 03/03/1941 DOA: 03/05/2024  PCP: Claudene Pellet, MD   Patient coming from: Home   Chief Complaint: Fall, increased confusion   HPI: Monique Snyder is an 83 y.o. female with medical history significant for hypertension, hyperlipidemia, CKD stage III, and recent dementia diagnosis per report of family who presents with increased confusion and a fall.  Patient is accompanied by her daughter whom she currently lives with, and her son-in-law.  She was seated at a table with her son-in-law when she appeared to fall asleep and fell to the ground.  She seemed to have some slurred speech initially, her daughter was concerned for stroke, and therefore called EMS.  She was lethargic with EMS, is said to have had heart rate in the 40s, and was given atropine prior to arrival in the ED.  Patient is alert and interactive in the emergency department but still more confused than usual per report of her daughter at the bedside.  The patient repeatedly asked why she was brought here instead of the hospital in [place].  She denies any headache or neck pain, chest pain, lightheadedness, abdominal pain, flank pain, or dysuria.  ED Course: Upon arrival to the ED, patient is found to be afebrile and saturating well on room air with heart rate 47-69 and stable BP.  Labs are most notable for creatinine 1.41, normal WBC, normal troponin x 2, negative respiratory virus panel, and UA with rare bacteria, moderate leukocytes, and 6-10 WBC/hpf.  There are no acute findings on head CT or CT of the cervical spine.  Patient was treated with 500 mL of NS and 1 g IV Rocephin  in the ED.  Review of Systems:  All other systems reviewed and apart from HPI, are negative.  Past Medical History:  Diagnosis Date   Adenocarcinoma (HCC)    Anxiety    Cecum mass    CKD stage 3b, GFR 30-44 ml/min (HCC) 03/06/2024   Herniated disc    HLD (hyperlipidemia)     Hypertension    Overweight(278.02)     Past Surgical History:  Procedure Laterality Date   COLECTOMY      Social History:   reports that she has never smoked. She has never used smokeless tobacco. She reports that she does not drink alcohol and does not use drugs.  Allergies  Allergen Reactions   Penicillins Hives and Rash    Family History  Problem Relation Age of Onset   Heart disease Father    Heart disease Brother      Prior to Admission medications   Medication Sig Start Date End Date Taking? Authorizing Provider  ALPRAZolam (XANAX) 0.5 MG tablet Take 0.25 mg by mouth every morning.   Yes [provider]  amLODipine  (NORVASC ) 10 MG tablet Take 10 mg by mouth daily.   Yes [provider]  spironolactone (ALDACTONE) 25 MG tablet Take 25 mg by mouth daily.   Yes [provider]  telmisartan (MICARDIS) 80 MG tablet Take 80 mg by mouth daily. 06/21/23  Yes [provider]  memantine  (NAMENDA ) 5 MG tablet Take 1 tablet (5 mg at night) for 2 weeks, then increase to 1 tablet (5 mg) twice a day Patient not taking: Reported on 03/05/2024 11/28/23   Wertman, Sara E, PA-C  olmesartan -hydrochlorothiazide (BENICAR  HCT) 40-25 MG tablet Take 1 tablet by mouth daily. Patient not taking: Reported on 03/05/2024    [provider]    Physical Exam: Vitals:  03/05/24 2100 03/05/24 2200 03/05/24 2338 03/06/24 0000  BP: 119/61 (!) 116/90 (!) 144/76   Pulse: (!) 47  (!) 48   Resp: 16 16 17    Temp:    97.6 F (36.4 C)  TempSrc:      SpO2: 98%  100%   Height:        Constitutional: NAD, no pallor or diaphoresis   Eyes: PERTLA, lids and conjunctivae normal ENMT: Mucous membranes are moist. Posterior pharynx clear of any exudate or lesions.   Neck: supple, no masses  Respiratory: no wheezing, no crackles. No accessory muscle use.  Cardiovascular: S1 & S2 heard, regular rate and rhythm. No extremity edema.  Abdomen: No tenderness, soft. Bowel  sounds active.  Musculoskeletal: no clubbing / cyanosis. No joint deformity upper and lower extremities.   Skin: no significant rashes, lesions, ulcers. Warm, dry, well-perfused. Neurologic: CN 2-12 grossly intact. Sensation to light touch intact. Strength 5/5 in all 4 limbs. Alert and oriented to person only.  Psychiatric: Calm. Cooperative.    Labs and Imaging on Admission: I have personally reviewed following labs and imaging studies  CBC: Recent Labs  Lab 03/05/24 1554  WBC 4.4  NEUTROABS 2.4  HGB 10.8*  HCT 33.7*  MCV 93.4  PLT 304   Basic Metabolic Panel: Recent Labs  Lab 03/05/24 1554  NA 139  K 4.3  CL 105  CO2 24  GLUCOSE 88  BUN 24*  CREATININE 1.41*  CALCIUM 9.4  MG 2.1   GFR: CrCl cannot be calculated (Unknown ideal weight.). Liver Function Tests: Recent Labs  Lab 03/05/24 1554  AST 22  ALT 16  ALKPHOS 62  BILITOT 0.6  PROT 6.4*  ALBUMIN 3.4*   No results for input(s): LIPASE, AMYLASE in the last 168 hours. No results for input(s): AMMONIA in the last 168 hours. Coagulation Profile: No results for input(s): INR, PROTIME in the last 168 hours. Cardiac Enzymes: No results for input(s): CKTOTAL, CKMB, CKMBINDEX, TROPONINI in the last 168 hours. BNP (last 3 results) No results for input(s): PROBNP in the last 8760 hours. HbA1C: No results for input(s): HGBA1C in the last 72 hours. CBG: No results for input(s): GLUCAP in the last 168 hours. Lipid Profile: No results for input(s): CHOL, HDL, LDLCALC, TRIG, CHOLHDL, LDLDIRECT in the last 72 hours. Thyroid  Function Tests: No results for input(s): TSH, T4TOTAL, FREET4, T3FREE, THYROIDAB in the last 72 hours. Anemia Panel: No results for input(s): VITAMINB12, FOLATE, FERRITIN, TIBC, IRON, RETICCTPCT in the last 72 hours. Urine analysis:    Component Value Date/Time   COLORURINE STRAW (A) 03/05/2024 1622   APPEARANCEUR CLEAR 03/05/2024 1622    LABSPEC 1.006 03/05/2024 1622   PHURINE 7.0 03/05/2024 1622   GLUCOSEU NEGATIVE 03/05/2024 1622   HGBUR NEGATIVE 03/05/2024 1622   BILIRUBINUR NEGATIVE 03/05/2024 1622   KETONESUR NEGATIVE 03/05/2024 1622   PROTEINUR NEGATIVE 03/05/2024 1622   NITRITE NEGATIVE 03/05/2024 1622   LEUKOCYTESUR MODERATE (A) 03/05/2024 1622   Sepsis Labs: @LABRCNTIP (procalcitonin:4,lacticidven:4) ) Recent Results (from the past 240 hours)  Resp panel by RT-PCR (RSV, Flu A&B, Covid) Anterior Nasal Swab     Status: None   Collection Time: 03/05/24  5:48 PM   Specimen: Anterior Nasal Swab  Result Value Ref Range Status   SARS Coronavirus 2 by RT PCR NEGATIVE NEGATIVE Final   Influenza A by PCR NEGATIVE NEGATIVE Final   Influenza B by PCR NEGATIVE NEGATIVE Final    Comment: (NOTE) The Xpert Xpress SARS-CoV-2/FLU/RSV plus assay is  intended as an aid in the diagnosis of influenza from Nasopharyngeal swab specimens and should not be used as a sole basis for treatment. Nasal washings and aspirates are unacceptable for Xpert Xpress SARS-CoV-2/FLU/RSV testing.  Fact Sheet for Patients: BloggerCourse.com  Fact Sheet for Healthcare Providers: SeriousBroker.it  This test is not yet approved or cleared by the [place]  FDA and has been authorized for detection and/or diagnosis of SARS-CoV-2 by FDA under an Emergency Use Authorization (EUA). This EUA will remain in effect (meaning this test can be used) for the duration of the COVID-19 declaration under Section 564(b)(1) of the Act, 21 U.S.C. section 360bbb-3(b)(1), unless the authorization is terminated or revoked.     Resp Syncytial Virus by PCR NEGATIVE NEGATIVE Final    Comment: (NOTE) Fact Sheet for Patients: BloggerCourse.com  Fact Sheet for Healthcare Providers: SeriousBroker.it  This test is not yet approved or cleared by the [place]   FDA and has been authorized for detection and/or diagnosis of SARS-CoV-2 by FDA under an Emergency Use Authorization (EUA). This EUA will remain in effect (meaning this test can be used) for the duration of the COVID-19 declaration under Section 564(b)(1) of the Act, 21 U.S.C. section 360bbb-3(b)(1), unless the authorization is terminated or revoked.  Performed at [facility], [address], [place], [place] [address]      Radiological Exams on Admission: CT HEAD WO CONTRAST ( ) Result Date: 03/05/2024 CLINICAL DATA:  Mental status change, unknown cause; Polytrauma, blunt EXAM: CT HEAD WITHOUT CONTRAST CT CERVICAL SPINE WITHOUT CONTRAST TECHNIQUE: Multidetector CT imaging of the head and cervical spine was performed following the standard protocol without intravenous contrast. Multiplanar CT image reconstructions of the cervical spine were also generated. RADIATION DOSE REDUCTION: This exam was performed according to the departmental dose-optimization program which includes automated exposure control, adjustment of the mA and/or kV according to patient size and/or use of iterative reconstruction technique. COMPARISON:  MRI head 08/30/2023 FINDINGS: CT HEAD FINDINGS Brain: No evidence of large-territorial acute infarction. No parenchymal hemorrhage. No mass lesion. No extra-axial collection. No mass effect or midline shift. No hydrocephalus. Basilar cisterns are patent. Vascular: No hyperdense vessel. Skull: No acute fracture or focal lesion. Sinuses/Orbits: Paranasal sinuses and mastoid air cells are clear. The orbits are unremarkable. Other: None. CT CERVICAL SPINE FINDINGS Alignment: Normal. Skull base and vertebrae: Multilevel moderate degenerative change of the spine. No associated severe osseous neural foraminal or central canal stenosis. No acute fracture. No aggressive appearing focal osseous lesion or focal pathologic process. Soft tissues and spinal canal: No prevertebral fluid or  swelling. No visible canal hematoma. Upper chest: Unremarkable. Other: None. IMPRESSION: 1. No acute intracranial abnormality. 2. No acute displaced fracture or traumatic listhesis of the cervical spine. Electronically Signed   By: Morgane  Naveau M.D.   On: 03/05/2024 17:53   CT Cervical Spine Wo Contrast Result Date: 03/05/2024 CLINICAL DATA:  Mental status change, unknown cause; Polytrauma, blunt EXAM: CT HEAD WITHOUT CONTRAST CT CERVICAL SPINE WITHOUT CONTRAST TECHNIQUE: Multidetector CT imaging of the head and cervical spine was performed following the standard protocol without intravenous contrast. Multiplanar CT image reconstructions of the cervical spine were also generated. RADIATION DOSE REDUCTION: This exam was performed according to the departmental dose-optimization program which includes automated exposure control, adjustment of the mA and/or kV according to patient size and/or use of iterative reconstruction technique. COMPARISON:  MRI head 08/30/2023 FINDINGS: CT HEAD FINDINGS Brain: No evidence of large-territorial acute infarction. No parenchymal hemorrhage. No mass lesion.  No extra-axial collection. No mass effect or midline shift. No hydrocephalus. Basilar cisterns are patent. Vascular: No hyperdense vessel. Skull: No acute fracture or focal lesion. Sinuses/Orbits: Paranasal sinuses and mastoid air cells are clear. The orbits are unremarkable. Other: None. CT CERVICAL SPINE FINDINGS Alignment: Normal. Skull base and vertebrae: Multilevel moderate degenerative change of the spine. No associated severe osseous neural foraminal or central canal stenosis. No acute fracture. No aggressive appearing focal osseous lesion or focal pathologic process. Soft tissues and spinal canal: No prevertebral fluid or swelling. No visible canal hematoma. Upper chest: Unremarkable. Other: None. IMPRESSION: 1. No acute intracranial abnormality. 2. No acute displaced fracture or traumatic listhesis of the cervical  spine. Electronically Signed   By: Morgane  Naveau M.D.   On: 03/05/2024 17:53   DG Chest Port 1 View Result Date: 03/05/2024 CLINICAL DATA:  Altered mental status EXAM: PORTABLE CHEST 1 VIEW COMPARISON:  03/19/2007 FINDINGS: Borderline enlargement of the cardiopericardial silhouette without edema. Atherosclerotic calcification of the aortic arch. The lungs appear clear. Mild mid and lower thoracic spondylosis. IMPRESSION: 1. Borderline enlargement of the cardiopericardial silhouette, without edema. 2. Mild thoracic spondylosis. Electronically Signed   By: Ryan Salvage M.D.   On: 03/05/2024 17:07    EKG: Independently reviewed. Sinus rhythm, rate 61, repolarization abnormality.   Assessment/Plan   1. Acute encephalopathy  - Presents with increased confusion with underlying dementia  - No acute head CT findings in ED  - Treated for possible UTI in ED - Check ammonia, TSH, B12, and RPR, continue Rocephin  for possible UTI, continue IVF hydration, hold Xanax, follow clinical course    2. Hypertension  - Continue amlodipine    3. CKD 3B  - SCr is 1.41 in ED  - Baseline unclear, was 1.3 in January, 1.6 in March, and 2.3 in June 2025  - Renally-dose medications, monitor   4. Dementia  - Delirium precautions   5. Anxiety - Hold Xanax for now given her increased confusion    DVT prophylaxis: sq heparin   Code Status: Full  Level of Care: Level of care: Telemetry Cardiac Family Communication: Daughter at bedside  Disposition Plan:  Patient is from: Home  Anticipated d/c is to: home  Anticipated d/c date is: 8/27 or 03/07/24  Patient currently: Pending AMS workup Consults called: None  Admission status: Observation     Evalene GORMAN Sprinkles, MD [facility]  03/06/2024, 1:56 AM

## 2024-03-07 DIAGNOSIS — R001 Bradycardia, unspecified: Secondary | ICD-10-CM | POA: Diagnosis not present

## 2024-03-07 DIAGNOSIS — G9340 Encephalopathy, unspecified: Secondary | ICD-10-CM | POA: Diagnosis not present

## 2024-03-07 DIAGNOSIS — I10 Essential (primary) hypertension: Secondary | ICD-10-CM | POA: Diagnosis not present

## 2024-03-07 LAB — BASIC METABOLIC PANEL WITH GFR
Anion gap: 8 (ref 5–15)
BUN: 32 mg/dL — ABNORMAL HIGH (ref 8–23)
CO2: 24 mmol/L (ref 22–32)
Calcium: 9.3 mg/dL (ref 8.9–10.3)
Chloride: 105 mmol/L (ref 98–111)
Creatinine, Ser: 1.65 mg/dL — ABNORMAL HIGH (ref 0.44–1.00)
GFR, Estimated: 31 mL/min — ABNORMAL LOW (ref >60)
Glucose, Bld: 85 mg/dL (ref 70–99)
Potassium: 4.3 mmol/L (ref 3.5–5.1)
Sodium: 137 mmol/L (ref 135–145)

## 2024-03-07 LAB — CBC
HCT: 32.6 % — ABNORMAL LOW (ref 36.0–46.0)
Hemoglobin: 10.6 g/dL — ABNORMAL LOW (ref 12.0–15.0)
MCH: 29.8 pg (ref 26.0–34.0)
MCHC: 32.5 g/dL (ref 30.0–36.0)
MCV: 91.6 fL (ref 80.0–100.0)
Platelets: 323 K/uL (ref 150–400)
RBC: 3.56 MIL/uL — ABNORMAL LOW (ref 3.87–5.11)
RDW: 15.7 % — ABNORMAL HIGH (ref 11.5–15.5)
WBC: 8 K/uL (ref 4.0–10.5)
nRBC: 0 % (ref 0.0–0.2)

## 2024-03-07 LAB — PHOSPHORUS: Phosphorus: 3.7 mg/dL (ref 2.5–4.6)

## 2024-03-07 LAB — MAGNESIUM: Magnesium: 1.8 mg/dL (ref 1.7–2.4)

## 2024-03-07 MED ORDER — CEFUROXIME AXETIL 500 MG PO TABS: 500.0000 mg | ORAL_TABLET | Freq: Every day | ORAL | 0 refills | Status: AC

## 2024-03-07 MED ORDER — CEPHALEXIN 500 MG PO CAPS: 500.0000 mg | ORAL_CAPSULE | Freq: Two times a day (BID) | ORAL | 0 refills | Status: DC

## 2024-03-07 NOTE — Progress Notes (Signed)
 Patient has AMS and has a Comptroller.  Jun RN will discharge patient once daughter arrives.  ETA 1 hour.

## 2024-03-07 NOTE — Evaluation (Signed)
 Occupational Therapy Evaluation Patient Details Name: Monique Snyder MRN: [id number] DOB: 01-22-1941 Today's Date: 03/07/2024   History of Present Illness   Pt is an 83 yo female presenting to [facility] ED on 03/05/24 with increased confusion and fall, positive for UTI. CT negative for acute abnormalities. PMH of underlying dementia, CKD, anxiety, HLD, HTN. Herniated disc.     Clinical Impressions Pt admitted for above, PTA pt lived with daughter but will be staying at her ILF at DC, the daughter and pt report that she was ind with ADLs and mobility PTA, managing her own medications without fault. PT A&Ox2 on eval, has some lingering cognitive deficits listed below but mainly struggles with new learning, able to complete routine tasks without challenge. Pt able to ambulate with supervision for safety given her cognition and completes ADLs with supervision/setup. Discussed current cognitive deficits with pt daughter and she reports that she will be with pt for a while after DC to ensure recovery and smooth transition to ILF, will also assist with managing meds. OT to continue following pt acutely to continue progressing pt's cognition and/or educating pt and family on compensatory strategies prn. No post acute OT rec.      If plan is discharge home, recommend the following:   Direct supervision/assist for financial management;Supervision due to cognitive status;Direct supervision/assist for medications management     Functional Status Assessment   Patient has had a recent decline in their functional status and demonstrates the ability to make significant improvements in function in a reasonable and predictable amount of time.     Equipment Recommendations   None recommended by OT     Recommendations for Other Services         Precautions/Restrictions   Precautions Precautions: Fall Recall of Precautions/Restrictions: Impaired Restrictions Weight Bearing Restrictions Per  Provider Order: No     Mobility Bed Mobility               General bed mobility comments: pt OOB on arrival    Transfers Overall transfer level: Modified independent Equipment used: None                      Balance Overall balance assessment: Mild deficits observed, not formally tested                                         ADL either performed or assessed with clinical judgement   ADL Overall ADL's : Needs assistance/impaired Eating/Feeding: Independent;Sitting   Grooming: Oral care;Standing   Upper Body Bathing: Standing;Supervision/ safety   Lower Body Bathing: Sit to/from stand;Supervison/ safety   Upper Body Dressing : Sitting;Set up   Lower Body Dressing: Set up;Sit to/from stand   Toilet Transfer: Supervision/safety   Toileting- Architect and Hygiene: Supervision/safety;Sit to/from stand       Functional mobility during ADLs: Supervision/safety       Vision   Vision Assessment?: No apparent visual deficits     Perception Perception: Not tested       Praxis Praxis: WFL       Pertinent Vitals/Pain Pain Assessment Pain Assessment: No/denies pain     Extremity/Trunk Assessment Upper Extremity Assessment Upper Extremity Assessment: Overall WFL for tasks assessed   Lower Extremity Assessment Lower Extremity Assessment: Overall WFL for tasks assessed   Cervical / Trunk Assessment Cervical / Trunk Assessment: Normal   Communication Communication Communication:  No apparent difficulties   Cognition Arousal: Alert Behavior During Therapy: WFL for tasks assessed/performed Cognition: No family/caregiver present to determine baseline, Cognition impaired   Orientation impairments: Time, Situation (believes it is 2027 and unsure of admitting reason) Awareness: Online awareness impaired, Intellectual awareness impaired Memory impairment (select all impairments): Short-term memory, Declarative long-term  memory     OT - Cognition Comments: Pt scored 0/3 on STM word recall, pt labeled her clock backwards and extended the times to 15. She reports it is hard for her to process tasks because she has a lot to do so it is harder to keep up, unsure of relevance of this statement. Pt as able to state that she has early dementia, her daughter confirmed on the phone that pt is not quite at baseline based on information. Daughter will stay with her at DC and acceptive to assist with medications. impaired new learning.                 Following commands: Intact       Cueing  General Comments   Cueing Techniques: Verbal cues  VSS   Exercises     Shoulder Instructions      Home Living Family/patient expects to be discharged to:: Other (Comment) (ILF)                                 Additional Comments: pt daughter reports that pt cognition is typically sharp and has no noteable deficits      Prior Functioning/Environment Prior Level of Function : Independent/Modified Independent;Patient poor historian/Family not available             Mobility Comments: ind no AD ADLs Comments: ind    OT Problem List: Decreased cognition   OT Treatment/Interventions: Patient/family education;Cognitive remediation/compensation;Therapeutic activities      OT Goals(Current goals can be found in the care plan section)   Acute Rehab OT Goals Patient Stated Goal: to go home OT Goal Formulation: With patient Time For Goal Achievement: 03/21/24 Potential to Achieve Goals: Good ADL Goals Pt Will Perform Tub/Shower Transfer: Tub transfer;with modified independence;ambulating Additional ADL Goal #1: Pt will demonstrate understanding of at least 2 STM compensatory strategies. Additional ADL Goal #2: Pt family will verbalize understanding of the signs of progressing dementia symptoms   OT Frequency:  Min 2X/week    Co-evaluation              AM-PAC OT 6 Clicks Daily  Activity     Outcome Measure Help from another person eating meals?: None Help from another person taking care of personal grooming?: A Little Help from another person toileting, which includes using toliet, bedpan, or urinal?: A Little Help from another person bathing (including washing, rinsing, drying)?: A Little Help from another person to put on and taking off regular upper body clothing?: A Little Help from another person to put on and taking off regular lower body clothing?: A Little 6 Click Score: 19   End of Session Nurse Communication: Mobility status  Activity Tolerance: Patient tolerated treatment well Patient left: in bed;with call bell/phone within reach;with nursing/sitter in room  OT Visit Diagnosis: Other symptoms and signs involving cognitive function                Time: 9146-9086 OT Time Calculation (min): 20 min Charges:  OT General Charges $OT Visit: 1 Visit OT Evaluation $OT Eval Low Complexity: 1 Low  03/07/2024  AB, OTR/L  Acute Rehabilitation Services  Office: [phone]   Curtistine JONETTA Das 03/07/2024, 9:43 AM

## 2024-03-07 NOTE — Plan of Care (Signed)
   Problem: Education: Goal: Knowledge of General Education information will improve Description: Including pain rating scale, medication(s)/side effects and non-pharmacologic comfort measures Outcome: Progressing   Problem: Clinical Measurements: Goal: Will remain free from infection Outcome: Progressing

## 2024-03-07 NOTE — Discharge Instructions (Signed)
 Advised to follow-up with primary care physician in 1 week. Advised to take Keflex  500 mg twice daily for UTI. Advised to follow-up with cardiology Dr. Buzzy as scheduled.

## 2024-03-07 NOTE — Progress Notes (Signed)
 Pt has DC order. AVS was explained to pt and daughter at bedside, all questions answered. Pt left via wheelchair by Recruitment consultant.

## 2024-03-07 NOTE — Progress Notes (Signed)
 Progress Note  Patient Name: Monique Snyder Date of Encounter: 03/07/2024 Primary Cardiologist: Darryle ONEIDA Decent, MD   Subjective   Overnight normal function. Patient notes no symptoms.  Vital Signs    Vitals:   03/06/24 2031 03/06/24 2334 03/07/24 0451 03/07/24 0738  BP: (!) 143/63 (!) 148/63 (!) 140/64 (!) 145/74  Pulse: (!) 53 65 65 (!) 57  Resp: 18 18 17 18   Temp: 98.2 F (36.8 C) 98.7 F (37.1 C) 98.4 F (36.9 C) 98.1 F (36.7 C)  TempSrc: Oral Oral Oral Oral  SpO2: 99% 97% 97% 99%  Weight:   65.7 kg   Height:        Intake/Output Summary (Last 24 hours) at 03/07/2024 0923 Last data filed at 03/06/2024 2210 Gross per 24 hour  Intake 321 ml  Output 900 ml  Net -579 ml   Filed Weights   03/06/24 1123 03/07/24 0451  Weight: 66.3 kg 65.7 kg    Physical Exam   GEN: No acute distress.   Neck: No JVD Cardiac: regular bradycardia, no murmurs, rubs, or gallops.  Respiratory: Clear to auscultation bilaterally. GI: Soft, nontender, non-distended  MS: No edema, no shuffling gait able to walk to the end of the hallway with me with no issues NEURO: AOX2 (person and place; she knows she is getting checked out, she thinks its 2027)  Labs   Telemetry: sinus rhythm to sinus bradycardia; heart rate augments appropriately with ambulation   Chemistry Recent Labs  Lab 03/05/24 1554 03/06/24 0450 03/07/24 0256  NA 139 139 137  K 4.3 4.4 4.3  CL 105 108 105  CO2 24 24 24   GLUCOSE 88 102* 85  BUN 24* 28* 32*  CREATININE 1.41* 1.56* 1.65*  CALCIUM 9.4 9.2 9.3  PROT 6.4*  --   --   ALBUMIN 3.4*  --   --   AST 22  --   --   ALT 16  --   --   ALKPHOS 62  --   --   BILITOT 0.6  --   --   GFRNONAA 37* 33* 31*  ANIONGAP 10 7 8      Hematology Recent Labs  Lab 03/05/24 1554 03/06/24 0450 03/07/24 0256  WBC 4.4 4.8 8.0  RBC 3.61* 3.48* 3.56*  HGB 10.8* 10.7* 10.6*  HCT 33.7* 32.5* 32.6*  MCV 93.4 93.4 91.6  MCH 29.9 30.7 29.8  MCHC 32.0 32.9 32.5  RDW  16.2* 16.0* 15.7*  PLT 304 312 323     Cardiac Studies   Cardiac Studies & Procedures   ______________________________________________________________________________________________     ECHOCARDIOGRAM  ECHOCARDIOGRAM COMPLETE 03/06/2024  Narrative ECHOCARDIOGRAM REPORT    Patient Name:   ERCIA CRISAFULLI Date of Exam: 03/06/2024 Medical Rec #:  [id number]         Height:       62.0 in Accession #:    [id number]        Weight:       146.2 lb Date of Birth:  April 16, 1941         BSA:          1.673 m Patient Age:    83 years          BP:           159/84 mmHg Patient Gender: F                 HR:           56 bpm. Exam  Location:  Inpatient  Procedure: 2D Echo (Both Spectral and Color Flow Doppler were utilized during procedure).  Indications:    murmur  History:        Patient has prior history of Echocardiogram examinations, most recent 07/17/2020. Chronic kidney disease, Arrythmias:Bradycardia; Risk Factors:Hypertension and Dyslipidemia.  Sonographer:    Tinnie Barefoot RDCS Referring Phys: [id number] ZANE ADAMS   Sonographer Comments: Global longitudinal strain was attempted. IMPRESSIONS   1. Left ventricular ejection fraction, by estimation, is 60 to 65%. The left ventricle has normal function. The left ventricle has no regional wall motion abnormalities. There is moderate concentric left ventricular hypertrophy of the septal segment. Left ventricular diastolic parameters were normal. The average left ventricular global longitudinal strain is -19.9 %. The global longitudinal strain is normal. 2. Right ventricular systolic function is normal. The right ventricular size is normal. There is mildly elevated pulmonary artery systolic pressure. 3. Left atrial size was mildly dilated. 4. The mitral valve is normal in structure. No evidence of mitral valve regurgitation. No evidence of mitral stenosis. 5. The aortic valve is normal in structure. Aortic valve regurgitation is  not visualized. No aortic stenosis is present. 6. The inferior vena cava is normal in size with greater than 50% respiratory variability, suggesting right atrial pressure of 3 mmHg.  FINDINGS Left Ventricle: Left ventricular ejection fraction, by estimation, is 60 to 65%. The left ventricle has normal function. The left ventricle has no regional wall motion abnormalities. The average left ventricular global longitudinal strain is -19.9 %. Strain was performed and the global longitudinal strain is normal. The left ventricular internal cavity size was normal in size. There is moderate concentric left ventricular hypertrophy of the septal segment. Left ventricular diastolic parameters were normal.  Right Ventricle: The right ventricular size is normal. No increase in right ventricular wall thickness. Right ventricular systolic function is normal. There is mildly elevated pulmonary artery systolic pressure. The tricuspid regurgitant velocity is 3.07 m/s, and with an assumed right atrial pressure of 3 mmHg, the estimated right ventricular systolic pressure is 40.7 mmHg.  Left Atrium: Left atrial size was mildly dilated.  Right Atrium: Right atrial size was normal in size.  Pericardium: There is no evidence of pericardial effusion.  Mitral Valve: The mitral valve is normal in structure. No evidence of mitral valve regurgitation. No evidence of mitral valve stenosis.  Tricuspid Valve: The tricuspid valve is normal in structure. Tricuspid valve regurgitation is mild . No evidence of tricuspid stenosis.  Aortic Valve: The aortic valve is normal in structure. Aortic valve regurgitation is not visualized. No aortic stenosis is present.  Pulmonic Valve: The pulmonic valve was normal in structure. Pulmonic valve regurgitation is trivial. No evidence of pulmonic stenosis.  Aorta: The aortic root is normal in size and structure.  Venous: The inferior vena cava is normal in size with greater than 50%  respiratory variability, suggesting right atrial pressure of 3 mmHg.  IAS/Shunts: No atrial level shunt detected by color flow Doppler.   LEFT VENTRICLE PLAX 2D LVIDd:         4.60 cm   Diastology LVIDs:         2.50 cm   LV e' medial:    6.31 cm/s LV PW:         1.00 cm   LV E/e' medial:  15.1 LV IVS:        1.60 cm   LV e' lateral:   7.94 cm/s LVOT diam:     1.80  cm   LV E/e' lateral: 12.0 LV SV:         79 LV SV Index:   47        2D Longitudinal Strain LVOT Area:     2.54 cm  2D Strain GLS Avg:     -19.9 %   RIGHT VENTRICLE             IVC RV Basal diam:  2.60 cm     IVC diam: 1.30 cm RV S prime:     15.10 cm/s TAPSE (M-mode): 2.5 cm  LEFT ATRIUM             Index        RIGHT ATRIUM           Index LA diam:        4.10 cm 2.45 cm/m   RA Area:     14.40 cm LA Vol (A2C):   68.8 ml 41.12 ml/m  RA Volume:   37.40 ml  22.35 ml/m LA Vol (A4C):   50.9 ml 30.42 ml/m LA Biplane Vol: 60.1 ml 35.92 ml/m AORTIC VALVE             PULMONIC VALVE LVOT Vmax:   153.00 cm/s PR End Diast Vel: 5.29 msec LVOT Vmean:  95.650 cm/s LVOT VTI:    0.312 m  AORTA Ao Root diam: 2.70 cm Ao Asc diam:  3.20 cm  MITRAL VALVE               TRICUSPID VALVE MV Area (PHT): 3.42 cm    TR Peak grad:   37.7 mmHg MV Decel Time: 222 msec    TR Vmax:        307.00 cm/s MV E velocity: 95.40 cm/s MV A velocity: 83.20 cm/s  SHUNTS MV E/A ratio:  1.15        Systemic VTI:  0.31 m Systemic Diam: 1.80 cm  Aditya Sabharwal Electronically signed by Ria Commander Signature Date/Time: 03/06/2024/4:24:52 PM    Final          ______________________________________________________________________________________________           Assessment & Plan   Sinus bradycardia - asymptomatic with no evidence of high grade AV block - unclear with her dementia if she is a candidate for PPM if she were to needed it (presently no indication); I personally ambulated patient and saw appropriate heart rate  augmentation on telemetry and lack of symptoms - will arrange outpatient follow up with Dr. Rosslyn team; at that visit they can decide whether or not to pursue; she may be able to graduate from the practice  HTN - in the setting of dementia would not aim for aggressive targets; continue CCB as per primary - in the setting of her AKI, her MRA is not planned to be added back until outpatient consideration  Acute encephalopathy  - managed by TRH with mild increase in ammonia and UTI undergoing tx   For questions or updates, please contact [facility] Please consult [email or web address] for contact info under Cardiology/STEMI.      Stanly Leavens, MD FASE [facility] Cardiologist [facility]  [address] [place], [address] [place], [place] [address] [phone]  9:23 AM

## 2024-03-07 NOTE — Progress Notes (Signed)
 PT Cancellation Note  Patient Details Name: Deloros Beretta MRN: [id number] DOB: 24-Dec-1940   Cancelled Treatment:    Reason Eval/Treat Not Completed: PT screened, no needs identified, will sign off; per OT pt at baseline, no PT needs.    Montie Portal 03/07/2024, 9:17 AM Micheline Portal, PT Acute Rehabilitation Services Office:[phone] 03/07/2024

## 2024-03-07 NOTE — Discharge Summary (Signed)
 Physician Discharge Summary  Monique Snyder FMW:[id number] DOB: [birth year] DOA: 03/05/2024  PCP: Claudene Pellet, MD  Admit date: 03/05/2024  Discharge date: 03/07/2024  Admitted From: Home  Disposition:  Home  Recommendations for Outpatient Follow-up:  Follow up with PCP in 1-2 weeks. Please obtain BMP/CBC in one week. Advised to take Ceftin  500 mg daily for UTI x 7 days. Advised to follow-up urine culture and change antibiotics as appropriately. Advised to follow-up with Cardiology Dr. Burton as scheduled.  Home Health: None Equipment/Devices: None  Discharge Condition: Stable CODE STATUS: Full code Diet recommendation: Heart Healthy  Brief [facility] Course: This 83 yrs old female with PMH significant for hypertension, hyperlipidemia, CKD stage III, and recent dementia diagnosis per  family who presents to the ED with increased confusion and fall. She was seated at a table with her son-in-law when she appeared to fall asleep and fell to the ground. She seemed to have some slurred speech initially, her daughter was concerned for stroke, and therefore called EMS. She was lethargic with EMS, She have had heart rate in the 40s, and was given atropine prior to arrival in the ED. Patient is alert and interactive in the ED but She still has some confusion than usual as per daughter at bedside.  Workup in the ED reveals CT head and C-spine unremarkable.  UA positive for UTI.  Continue to have significant bradycardia in 40s and 50s.  Patient was admitted for further evaluation and started on IV hydration and IV Rocephin .  Cardiology was consulted.  Patient has no significant AV block.  Patient has made significant improvement.  She is back to her baseline mental status. Cardiology signed off,  recommended outpatient follow-up with Dr. CLEMENTEEN Betters. PT and OT recommended home health services.  Patient is being discharged home on Ceftin  500 mg PO daily for 7 days.  Discharge Diagnoses:   Principal Problem:   Acute encephalopathy Active Problems:   Hypertension   Memory impairment   CKD stage 3b, GFR 30-44 ml/min (HCC)  Acute encephalopathy : She presented with increased confusion with underlying dementia.  No acute head CT findings in ED. UA consistent with UTI. Started on empiric ceftriaxone  and urine culture sent. Ammonia level 47, slightly high, TSH, B12, and RPR, unremarkable.   Continue Rocephin  for possible UTI, Continue IVF hydration,  Hold Xanax, follow clinical course   Patient has made significant improvement. She is back to her baseline mental status. Patient being discharged on Ceftin  500 mg PO daily for 7 days.   Essential hypertension : Continue amlodipine  .   CKD IIIb: SCr is 1.41 in ED . Baseline unclear, was 1.3 in January, 1.6 in March, and 2.3 in June 2025  Renally-dose medications, monitor renal functions.   Dementia: Delirium precautions. Continue supportive care.   Anxiety Disorder: Hold Xanax for now given her increased confusion.   Bradycardia: Patient continues to have bradycardia,  heart rate remains in 50s. EMS found her heart rate in 40s and has received atropine. Cardiology is consulted.  Recommended outpatient follow-up,  no inpatient workup needed.  Discharge Instructions  Discharge Instructions     Call MD for:  difficulty breathing, headache or visual disturbances   Complete by: As directed    Call MD for:  persistant dizziness or light-headedness   Complete by: As directed    Call MD for:  persistant nausea and vomiting   Complete by: As directed    Diet - low sodium heart healthy   Complete by:  As directed    Diet Carb Modified   Complete by: As directed    Discharge instructions   Complete by: As directed    Advised to follow-up with primary care physician in 1 week. Advised to take Keflex  500 mg twice daily for UTI. Advised to follow-up with cardiology Dr. Buzzy as scheduled.   Increase activity slowly    Complete by: As directed       Allergies as of 03/07/2024       Reactions   Penicillins Hives, Rash        Medication List     STOP taking these medications    memantine  5 MG tablet Commonly known as: NAMENDA    olmesartan -hydrochlorothiazide 40-25 MG tablet Commonly known as: BENICAR  HCT       TAKE these medications    ALPRAZolam 0.5 MG tablet Commonly known as: XANAX Take 0.25 mg by mouth every morning.   amLODipine  10 MG tablet Commonly known as: NORVASC  Take 10 mg by mouth daily.   cefUROXime  500 MG tablet Commonly known as: CEFTIN  Take 1 tablet (500 mg total) by mouth daily for 7 days.   spironolactone 25 MG tablet Commonly known as: ALDACTONE Take 25 mg by mouth daily.   telmisartan 80 MG tablet Commonly known as: MICARDIS Take 80 mg by mouth daily.        Follow-up Information     Claudene Pellet, MD Follow up.   Specialty: Family Medicine Why: Please follow up in a week. Contact information: [address] [place] [address] [phone]         Barbaraann Darryle Ned, MD Follow up in 1 month(s).   Specialties: Cardiology, Internal Medicine, Radiology Contact information: [address] [place] [address] [phone]                Allergies  Allergen Reactions   Penicillins Hives and Rash    Consultations: Cardiology   Procedures/Studies: ECHOCARDIOGRAM COMPLETE Result Date: 03/06/2024    ECHOCARDIOGRAM REPORT   Patient Name:   Monique Snyder Date of Exam: 03/06/2024 Medical Rec #:  [id number]         Height:       62.0 in Accession #:    [id number]        Weight:       146.2 lb Date of Birth:  07-21-1940         BSA:          1.673 m Patient Age:    82 years          BP:           159/84 mmHg Patient Gender: F                 HR:           56 bpm. Exam Location:  Inpatient Procedure: 2D Echo (Both Spectral and Color Flow Doppler were utilized during            procedure). Indications:     murmur  History:        Patient has prior history of Echocardiogram examinations, most                 recent 07/17/2020. Chronic kidney disease, Arrythmias:Bradycardia;                 Risk Factors:Hypertension and Dyslipidemia.  Sonographer:    Tinnie Barefoot RDCS Referring Phys: [id number] ZANE ADAMS  Sonographer Comments: Global longitudinal strain was  attempted. IMPRESSIONS  1. Left ventricular ejection fraction, by estimation, is 60 to 65%. The left ventricle has normal function. The left ventricle has no regional wall motion abnormalities. There is moderate concentric left ventricular hypertrophy of the septal segment. Left ventricular diastolic parameters were normal. The average left ventricular global longitudinal strain is -19.9 %. The global longitudinal strain is normal.  2. Right ventricular systolic function is normal. The right ventricular size is normal. There is mildly elevated pulmonary artery systolic pressure.  3. Left atrial size was mildly dilated.  4. The mitral valve is normal in structure. No evidence of mitral valve regurgitation. No evidence of mitral stenosis.  5. The aortic valve is normal in structure. Aortic valve regurgitation is not visualized. No aortic stenosis is present.  6. The inferior vena cava is normal in size with greater than 50% respiratory variability, suggesting right atrial pressure of 3 mmHg. FINDINGS  Left Ventricle: Left ventricular ejection fraction, by estimation, is 60 to 65%. The left ventricle has normal function. The left ventricle has no regional wall motion abnormalities. The average left ventricular global longitudinal strain is -19.9 %. Strain was performed and the global longitudinal strain is normal. The left ventricular internal cavity size was normal in size. There is moderate concentric left ventricular hypertrophy of the septal segment. Left ventricular diastolic parameters were normal. Right Ventricle: The right ventricular size is normal. No  increase in right ventricular wall thickness. Right ventricular systolic function is normal. There is mildly elevated pulmonary artery systolic pressure. The tricuspid regurgitant velocity is 3.07  m/s, and with an assumed right atrial pressure of 3 mmHg, the estimated right ventricular systolic pressure is 40.7 mmHg. Left Atrium: Left atrial size was mildly dilated. Right Atrium: Right atrial size was normal in size. Pericardium: There is no evidence of pericardial effusion. Mitral Valve: The mitral valve is normal in structure. No evidence of mitral valve regurgitation. No evidence of mitral valve stenosis. Tricuspid Valve: The tricuspid valve is normal in structure. Tricuspid valve regurgitation is mild . No evidence of tricuspid stenosis. Aortic Valve: The aortic valve is normal in structure. Aortic valve regurgitation is not visualized. No aortic stenosis is present. Pulmonic Valve: The pulmonic valve was normal in structure. Pulmonic valve regurgitation is trivial. No evidence of pulmonic stenosis. Aorta: The aortic root is normal in size and structure. Venous: The inferior vena cava is normal in size with greater than 50% respiratory variability, suggesting right atrial pressure of 3 mmHg. IAS/Shunts: No atrial level shunt detected by color flow Doppler.  LEFT VENTRICLE PLAX 2D LVIDd:         4.60 cm   Diastology LVIDs:         2.50 cm   LV e' medial:    6.31 cm/s LV PW:         1.00 cm   LV E/e' medial:  15.1 LV IVS:        1.60 cm   LV e' lateral:   7.94 cm/s LVOT diam:     1.80 cm   LV E/e' lateral: 12.0 LV SV:         79 LV SV Index:   47        2D Longitudinal Strain LVOT Area:     2.54 cm  2D Strain GLS Avg:     -19.9 %  RIGHT VENTRICLE             IVC RV Basal diam:  2.60 cm  IVC diam: 1.30 cm RV S prime:     15.10 cm/s TAPSE (M-mode): 2.5 cm LEFT ATRIUM             Index        RIGHT ATRIUM           Index LA diam:        4.10 cm 2.45 cm/m   RA Area:     14.40 cm LA Vol (A2C):   68.8 ml 41.12  ml/m  RA Volume:   37.40 ml  22.35 ml/m LA Vol (A4C):   50.9 ml 30.42 ml/m LA Biplane Vol: 60.1 ml 35.92 ml/m  AORTIC VALVE             PULMONIC VALVE LVOT Vmax:   153.00 cm/s PR End Diast Vel: 5.29 msec LVOT Vmean:  95.650 cm/s LVOT VTI:    0.312 m  AORTA Ao Root diam: 2.70 cm Ao Asc diam:  3.20 cm MITRAL VALVE               TRICUSPID VALVE MV Area (PHT): 3.42 cm    TR Peak grad:   37.7 mmHg MV Decel Time: 222 msec    TR Vmax:        307.00 cm/s MV E velocity: 95.40 cm/s MV A velocity: 83.20 cm/s  SHUNTS MV E/A ratio:  1.15        Systemic VTI:  0.31 m                            Systemic Diam: 1.80 cm Aditya Sabharwal Electronically signed by Ria Commander Signature Date/Time: 03/06/2024/4:24:52 PM    Final    CT HEAD WO CONTRAST ( ) Result Date: 03/05/2024 CLINICAL DATA:  Mental status change, unknown cause; Polytrauma, blunt EXAM: CT HEAD WITHOUT CONTRAST CT CERVICAL SPINE WITHOUT CONTRAST TECHNIQUE: Multidetector CT imaging of the head and cervical spine was performed following the standard protocol without intravenous contrast. Multiplanar CT image reconstructions of the cervical spine were also generated. RADIATION DOSE REDUCTION: This exam was performed according to the departmental dose-optimization program which includes automated exposure control, adjustment of the mA and/or kV according to patient size and/or use of iterative reconstruction technique. COMPARISON:  MRI head 08/30/2023 FINDINGS: CT HEAD FINDINGS Brain: No evidence of large-territorial acute infarction. No parenchymal hemorrhage. No mass lesion. No extra-axial collection. No mass effect or midline shift. No hydrocephalus. Basilar cisterns are patent. Vascular: No hyperdense vessel. Skull: No acute fracture or focal lesion. Sinuses/Orbits: Paranasal sinuses and mastoid air cells are clear. The orbits are unremarkable. Other: None. CT CERVICAL SPINE FINDINGS Alignment: Normal. Skull base and vertebrae: Multilevel moderate  degenerative change of the spine. No associated severe osseous neural foraminal or central canal stenosis. No acute fracture. No aggressive appearing focal osseous lesion or focal pathologic process. Soft tissues and spinal canal: No prevertebral fluid or swelling. No visible canal hematoma. Upper chest: Unremarkable. Other: None. IMPRESSION: 1. No acute intracranial abnormality. 2. No acute displaced fracture or traumatic listhesis of the cervical spine. Electronically Signed   By: Morgane  Naveau M.D.   On: 03/05/2024 17:53   CT Cervical Spine Wo Contrast Result Date: 03/05/2024 CLINICAL DATA:  Mental status change, unknown cause; Polytrauma, blunt EXAM: CT HEAD WITHOUT CONTRAST CT CERVICAL SPINE WITHOUT CONTRAST TECHNIQUE: Multidetector CT imaging of the head and cervical spine was performed following the standard protocol without intravenous contrast. Multiplanar CT image reconstructions of the cervical spine were  also generated. RADIATION DOSE REDUCTION: This exam was performed according to the departmental dose-optimization program which includes automated exposure control, adjustment of the mA and/or kV according to patient size and/or use of iterative reconstruction technique. COMPARISON:  MRI head 08/30/2023 FINDINGS: CT HEAD FINDINGS Brain: No evidence of large-territorial acute infarction. No parenchymal hemorrhage. No mass lesion. No extra-axial collection. No mass effect or midline shift. No hydrocephalus. Basilar cisterns are patent. Vascular: No hyperdense vessel. Skull: No acute fracture or focal lesion. Sinuses/Orbits: Paranasal sinuses and mastoid air cells are clear. The orbits are unremarkable. Other: None. CT CERVICAL SPINE FINDINGS Alignment: Normal. Skull base and vertebrae: Multilevel moderate degenerative change of the spine. No associated severe osseous neural foraminal or central canal stenosis. No acute fracture. No aggressive appearing focal osseous lesion or focal pathologic process.  Soft tissues and spinal canal: No prevertebral fluid or swelling. No visible canal hematoma. Upper chest: Unremarkable. Other: None. IMPRESSION: 1. No acute intracranial abnormality. 2. No acute displaced fracture or traumatic listhesis of the cervical spine. Electronically Signed   By: Morgane  Naveau M.D.   On: 03/05/2024 17:53   DG Chest Port 1 View Result Date: 03/05/2024 CLINICAL DATA:  Altered mental status EXAM: PORTABLE CHEST 1 VIEW COMPARISON:  03/19/2007 FINDINGS: Borderline enlargement of the cardiopericardial silhouette without edema. Atherosclerotic calcification of the aortic arch. The lungs appear clear. Mild mid and lower thoracic spondylosis. IMPRESSION: 1. Borderline enlargement of the cardiopericardial silhouette, without edema. 2. Mild thoracic spondylosis. Electronically Signed   By: Ryan Salvage M.D.   On: 03/05/2024 17:07    Subjective: Patient was seen and examined at bedside.  Overnight events noted. Patient reports feeling much better,  back to her baseline mental status.   She wants to be discharged home, denies any urinary symptoms.  Discharge Exam: Vitals:   03/07/24 0738 03/07/24 1104  BP: (!) 145/74 121/64  Pulse: (!) 57   Resp: 18 18  Temp: 98.1 F (36.7 C) 98.7 F (37.1 C)  SpO2: 99% 99%   Vitals:   03/06/24 2334 03/07/24 0451 03/07/24 0738 03/07/24 1104  BP: (!) 148/63 (!) 140/64 (!) 145/74 121/64  Pulse: 65 65 (!) 57   Resp: 18 17 18 18   Temp: 98.7 F (37.1 C) 98.4 F (36.9 C) 98.1 F (36.7 C) 98.7 F (37.1 C)  TempSrc: Oral Oral Oral Oral  SpO2: 97% 97% 99% 99%  Weight:  65.7 kg    Height:        General: Pt is alert, awake, not in acute distress Cardiovascular: RRR, S1/S2 +, no rubs, no gallops Respiratory: CTA bilaterally, no wheezing, no rhonchi Abdominal: Soft, NT, ND, bowel sounds + Extremities: no edema, no cyanosis    The results of significant diagnostics from this hospitalization (including imaging, microbiology, ancillary  and laboratory) are listed below for reference.     Microbiology: Recent Results (from the past 240 hours)  Resp panel by RT-PCR (RSV, Flu A&B, Covid) Anterior Nasal Swab     Status: None   Collection Time: 03/05/24  5:48 PM   Specimen: Anterior Nasal Swab  Result Value Ref Range Status   SARS Coronavirus 2 by RT PCR NEGATIVE NEGATIVE Final   Influenza A by PCR NEGATIVE NEGATIVE Final   Influenza B by PCR NEGATIVE NEGATIVE Final    Comment: (NOTE) The Xpert Xpress SARS-CoV-2/FLU/RSV plus assay is intended as an aid in the diagnosis of influenza from Nasopharyngeal swab specimens and should not be used as a sole basis for treatment.  Nasal washings and aspirates are unacceptable for Xpert Xpress SARS-CoV-2/FLU/RSV testing.  Fact Sheet for Patients: BloggerCourse.com  Fact Sheet for Healthcare Providers: SeriousBroker.it  This test is not yet approved or cleared by the [place]  FDA and has been authorized for detection and/or diagnosis of SARS-CoV-2 by FDA under an Emergency Use Authorization (EUA). This EUA will remain in effect (meaning this test can be used) for the duration of the COVID-19 declaration under Section 564(b)(1) of the Act, 21 U.S.C. section 360bbb-3(b)(1), unless the authorization is terminated or revoked.     Resp Syncytial Virus by PCR NEGATIVE NEGATIVE Final    Comment: (NOTE) Fact Sheet for Patients: BloggerCourse.com  Fact Sheet for Healthcare Providers: SeriousBroker.it  This test is not yet approved or cleared by the [place]  FDA and has been authorized for detection and/or diagnosis of SARS-CoV-2 by FDA under an Emergency Use Authorization (EUA). This EUA will remain in effect (meaning this test can be used) for the duration of the COVID-19 declaration under Section 564(b)(1) of the Act, 21 U.S.C. section 360bbb-3(b)(1), unless the  authorization is terminated or revoked.  Performed at [facility], [address] [place], [place] [address]      Labs: BNP (last 3 results) No results for input(s): BNP in the last 8760 hours. Basic Metabolic Panel: Recent Labs  Lab 03/05/24 1554 03/06/24 0450 03/07/24 0256  NA 139 139 137  K 4.3 4.4 4.3  CL 105 108 105  CO2 24 24 24   GLUCOSE 88 102* 85  BUN 24* 28* 32*  CREATININE 1.41* 1.56* 1.65*  CALCIUM 9.4 9.2 9.3  MG 2.1  --  1.8  PHOS  --   --  3.7   Liver Function Tests: Recent Labs  Lab 03/05/24 1554  AST 22  ALT 16  ALKPHOS 62  BILITOT 0.6  PROT 6.4*  ALBUMIN 3.4*   No results for input(s): LIPASE, AMYLASE in the last 168 hours. Recent Labs  Lab 03/06/24 0450  AMMONIA 47*   CBC: Recent Labs  Lab 03/05/24 1554 03/06/24 0450 03/07/24 0256  WBC 4.4 4.8 8.0  NEUTROABS 2.4  --   --   HGB 10.8* 10.7* 10.6*  HCT 33.7* 32.5* 32.6*  MCV 93.4 93.4 91.6  PLT 304 312 323   Cardiac Enzymes: No results for input(s): CKTOTAL, CKMB, CKMBINDEX, TROPONINI in the last 168 hours. BNP: Invalid input(s): POCBNP CBG: No results for input(s): GLUCAP in the last 168 hours. D-Dimer No results for input(s): DDIMER in the last 72 hours. Hgb A1c No results for input(s): HGBA1C in the last 72 hours. Lipid Profile No results for input(s): CHOL, HDL, LDLCALC, TRIG, CHOLHDL, LDLDIRECT in the last 72 hours. Thyroid  function studies Recent Labs    03/06/24 0450  TSH 1.178   Anemia work up Recent Labs    03/06/24 0450  VITAMINB12 758   Urinalysis    Component Value Date/Time   COLORURINE STRAW (A) 03/05/2024 1622   APPEARANCEUR CLEAR 03/05/2024 1622   LABSPEC 1.006 03/05/2024 1622   PHURINE 7.0 03/05/2024 1622   GLUCOSEU NEGATIVE 03/05/2024 1622   HGBUR NEGATIVE 03/05/2024 1622   BILIRUBINUR NEGATIVE 03/05/2024 1622   KETONESUR NEGATIVE 03/05/2024 1622   PROTEINUR NEGATIVE 03/05/2024 1622   NITRITE  NEGATIVE 03/05/2024 1622   LEUKOCYTESUR MODERATE (A) 03/05/2024 1622   Sepsis Labs Recent Labs  Lab 03/05/24 1554 03/06/24 0450 03/07/24 0256  WBC 4.4 4.8 8.0   Microbiology Recent Results (from the past 240 hours)  Resp  panel by RT-PCR (RSV, Flu A&B, Covid) Anterior Nasal Swab     Status: None   Collection Time: 03/05/24  5:48 PM   Specimen: Anterior Nasal Swab  Result Value Ref Range Status   SARS Coronavirus 2 by RT PCR NEGATIVE NEGATIVE Final   Influenza A by PCR NEGATIVE NEGATIVE Final   Influenza B by PCR NEGATIVE NEGATIVE Final    Comment: (NOTE) The Xpert Xpress SARS-CoV-2/FLU/RSV plus assay is intended as an aid in the diagnosis of influenza from Nasopharyngeal swab specimens and should not be used as a sole basis for treatment. Nasal washings and aspirates are unacceptable for Xpert Xpress SARS-CoV-2/FLU/RSV testing.  Fact Sheet for Patients: BloggerCourse.com  Fact Sheet for Healthcare Providers: SeriousBroker.it  This test is not yet approved or cleared by the [place]  FDA and has been authorized for detection and/or diagnosis of SARS-CoV-2 by FDA under an Emergency Use Authorization (EUA). This EUA will remain in effect (meaning this test can be used) for the duration of the COVID-19 declaration under Section 564(b)(1) of the Act, 21 U.S.C. section 360bbb-3(b)(1), unless the authorization is terminated or revoked.     Resp Syncytial Virus by PCR NEGATIVE NEGATIVE Final    Comment: (NOTE) Fact Sheet for Patients: BloggerCourse.com  Fact Sheet for Healthcare Providers: SeriousBroker.it  This test is not yet approved or cleared by the [place]  FDA and has been authorized for detection and/or diagnosis of SARS-CoV-2 by FDA under an Emergency Use Authorization (EUA). This EUA will remain in effect (meaning this test can be used) for the duration of  the COVID-19 declaration under Section 564(b)(1) of the Act, 21 U.S.C. section 360bbb-3(b)(1), unless the authorization is terminated or revoked.  Performed at [facility], [address] [place], [place] [address]      Time coordinating discharge: Over 30 minutes  SIGNED:   Darcel Dawley, MD  [facility] 03/07/2024, 1:40 PM Pager   If 7PM-7AM, please contact night-coverage

## 2024-03-07 NOTE — TOC Transition Note (Signed)
 Transition of Care [facility]) - Discharge Note   Patient Details  Name: Monique Snyder MRN: [id number] Date of Birth: 09/14/1940  [facility]) CM/SW Contact:  Waddell Barnie Rama, RN Phone Number: 03/07/2024, 12:06 PM   Clinical Narrative:    For dc today, daughter will transport to home. No needs.     Patient Goals and CMS Choice            Discharge Placement                       Discharge Plan and Services Additional resources added to the After Visit Summary for                                       Social Drivers of Health (SDOH) Interventions SDOH Screenings   Food Insecurity: No Food Insecurity (03/06/2024)  Housing: Low Risk  (03/06/2024)  Transportation Needs: No Transportation Needs (03/06/2024)  Utilities: Not At Risk (03/06/2024)  Social Connections: Moderately Integrated (03/06/2024)  Tobacco Use: Low Risk  (03/06/2024)     Readmission Risk Interventions    03/06/2024    4:29 PM  Readmission Risk Prevention Plan  Medication Screening Complete  Transportation Screening Complete

## 2024-03-08 LAB — URINE CULTURE: Culture: 20000 — AB

## 2024-03-10 DIAGNOSIS — E669 Obesity, unspecified: Secondary | ICD-10-CM | POA: Diagnosis not present

## 2024-03-10 DIAGNOSIS — E785 Hyperlipidemia, unspecified: Secondary | ICD-10-CM | POA: Diagnosis not present

## 2024-03-10 DIAGNOSIS — N1831 Chronic kidney disease, stage 3a: Secondary | ICD-10-CM | POA: Diagnosis not present

## 2024-03-10 DIAGNOSIS — N1832 Chronic kidney disease, stage 3b: Secondary | ICD-10-CM | POA: Diagnosis not present

## 2024-03-13 DIAGNOSIS — D649 Anemia, unspecified: Secondary | ICD-10-CM | POA: Diagnosis not present

## 2024-03-13 DIAGNOSIS — N184 Chronic kidney disease, stage 4 (severe): Secondary | ICD-10-CM | POA: Diagnosis not present

## 2024-03-13 DIAGNOSIS — I129 Hypertensive chronic kidney disease with stage 1 through stage 4 chronic kidney disease, or unspecified chronic kidney disease: Secondary | ICD-10-CM | POA: Diagnosis not present

## 2024-03-13 DIAGNOSIS — F0390 Unspecified dementia without behavioral disturbance: Secondary | ICD-10-CM | POA: Diagnosis not present

## 2024-03-13 DIAGNOSIS — N1832 Chronic kidney disease, stage 3b: Secondary | ICD-10-CM | POA: Diagnosis not present

## 2024-03-19 ENCOUNTER — Other Ambulatory Visit: Payer: Self-pay | Admitting: Adult Health

## 2024-03-19 DIAGNOSIS — G9340 Encephalopathy, unspecified: Secondary | ICD-10-CM | POA: Diagnosis not present

## 2024-03-19 DIAGNOSIS — I10 Essential (primary) hypertension: Secondary | ICD-10-CM | POA: Diagnosis not present

## 2024-03-19 DIAGNOSIS — F03A0 Unspecified dementia, mild, without behavioral disturbance, psychotic disturbance, mood disturbance, and anxiety: Secondary | ICD-10-CM | POA: Diagnosis not present

## 2024-03-19 DIAGNOSIS — Z09 Encounter for follow-up examination after completed treatment for conditions other than malignant neoplasm: Secondary | ICD-10-CM | POA: Diagnosis not present

## 2024-03-19 DIAGNOSIS — N1831 Chronic kidney disease, stage 3a: Secondary | ICD-10-CM | POA: Diagnosis not present

## 2024-03-19 DIAGNOSIS — N390 Urinary tract infection, site not specified: Secondary | ICD-10-CM | POA: Diagnosis not present

## 2024-03-27 ENCOUNTER — Other Ambulatory Visit: Payer: Self-pay | Admitting: Nephrology

## 2024-03-27 DIAGNOSIS — N1832 Chronic kidney disease, stage 3b: Secondary | ICD-10-CM

## 2024-04-01 ENCOUNTER — Ambulatory Visit: Attending: Emergency Medicine | Admitting: Emergency Medicine

## 2024-04-01 NOTE — Progress Notes (Deleted)
 Cardiology Office Note:    Date:  04/01/2024  ID:  [name], DOB [birth year], MRN [id number] PCP: Claudene Pellet, MD  [facility] Providers Cardiologist:  Darryle ONEIDA Decent, MD { Click to update primary MD,subspecialty MD or APP then REFRESH:1}    {Click to Open Review  :1}   Patient Profile:       Chief Complaint: *** History of Present Illness:  Monique Snyder is a 83 y.o. female with visit-pertinent history of bradycardia, hypertension, CKD stage IIIb, moderate LVH, depression and anxiety  She was seen by Dr. Decent for bradycardia on 06/2020.  EKG showed sinus bradycardia and LVH with a repolarization abnormality.  Echocardiogram was ordered showing LVEF of 60 to 65%, moderate concentric LVH, grade 1 DD, mild atrial dilation, trivial MR, and respiratory variability in IVC.  Last seen by outpatient cardiology on 06/2022.  Patient was noted to be overwhelmed caring for her daughter who had a stroke.  She had reported slightly elevated blood pressure at times close generally doing well from a cardiac perspective.  Patient was noted to be having problems with memory and was seeing a neurologist.  She was diagnosed with dementia that was concerning for Alzheimer's.  Patient had presented to the ED via EMS on 03/06/24 for increased confusion and recent fall.  Patient was seated with her son-in-law at the table when she fell asleep and fell to the ground.  Daughter was concerned this patient has some slurred speech and called EMS.  When EMS arrived patient was lethargic and had sinus bradycardia with rates that ranged from 42-49 and first-degree AV block.  And route to the ED she was given atropine.  On interview patient was alert and oriented 2/4.  Labs showed negative high-sensitivity troponins, increased ammonia 47 and elevated creatinine of 1.56.  Head CT showed no acute abnormality.  EKG showed normal sinus rhythm with heart rate of 61, inferior and lateral T wave inversion  seen on prior EKGs.  She did not meet guidelines for pacemaker and confusion is likely the cause of the fall.  Her urinalysis was positive for UTI and she was treated with IV antibiotics.  Patient was ambulated and she had appropriate heart rate augmentation on telemetry and lack of symptoms.  Echocardiogram on 8/27 showed LVEF 60 to 65%, no RWMA, moderate LVH of the septal segment, normal diastolic parameters, RV function and size normal, mildly elevated PASP, left atrial size mildly dilated, no valvular abnormalities.  Discussed the use of AI scribe software for clinical note transcription with the patient, who gave verbal consent to proceed.  History of Present Illness     Review of systems:  Please see the history of present illness. All other systems are reviewed and otherwise negative. ***      Studies Reviewed:        ***  Risk Assessment/Calculations:   {Does this patient have ATRIAL FIBRILLATION?:[phone]} No BP recorded.  {Refresh Note OR Click here to enter BP  :1}***        Physical Exam:   VS:  There were no vitals taken for this visit.   Wt Readings from Last 3 Encounters:  03/07/24 144 lb 12.8 oz (65.7 kg)  11/28/23 151 lb (68.5 kg)  07/08/22 165 lb 12.8 oz (75.2 kg)    GEN: Well nourished, well developed in no acute distress NECK: No JVD; No carotid bruits CARDIAC: ***RRR, no murmurs, rubs, gallops RESPIRATORY:  Clear to auscultation without rales, wheezing or rhonchi  ABDOMEN:  Soft, non-tender, non-distended EXTREMITIES:  No edema; No acute deformity ***      Assessment and Plan:    Assessment and Plan Assessment & Plan      {Are you ordering a CV Procedure (e.g. stress test, cath, DCCV, TEE, etc)?   Press F2        :[id number]}  Dispo:  No follow-ups on file.  Signed, Lum LITTIE Louis, NP

## 2024-04-04 ENCOUNTER — Ambulatory Visit: Admitting: Physician Assistant

## 2024-04-04 ENCOUNTER — Encounter: Payer: Self-pay | Admitting: Physician Assistant

## 2024-04-04 DIAGNOSIS — Z029 Encounter for administrative examinations, unspecified: Secondary | ICD-10-CM

## 2024-04-04 NOTE — Progress Notes (Incomplete)
 Assessment/Plan:     Dementia likely due to Alzheimer's disease  Monique Snyder is a delightful 83 y.o. RH female with a history ofhypertension, hyperlipidemia, CKD, depression, anxiety, bradycardia and a history of dementia likely due to Alzheimer's disease with behavioral disturbance seen today in follow up for memory loss. Patient is currently on memantine  10 mg twice daily***.  Patient is able to participate on ADLs***and to drive without significant difficulties.  Mood is***    Follow up in   months. Continue memantine  10 mg twice daily, side effects discussed*** Recommend good control of her cardiovascular risk factors.  Follow-up with cardiology, she has an appointment for bradycardia as her heart rate remains in the 50s Continue to control mood as per PCP Recommend no further driving      Subjective:    This patient is accompanied in the office by *** who supplements the history.  Previous records as well as any outside records available were reviewed prior to todays visit. Patient was last seen on 12/14/2023 via video visit, last MoCA on 11/28/2023 was 7/30***   Any changes in memory since last visit?   Short-term memory is worse than long-term memory although both of them are affected.   repeats oneself?  Endorsed Disoriented when walking into a room? Denies ***  Leaving objects?  She loses her phone and her glasses, but not in unusual places***  Wandering behavior?  denies   Any personality changes since last visit?  She is depressed since the death of her husband in 05-08-2013, caring for her daughter who had a stroke in 05-08-22.  Continues to be confrontations with patient, daughter her granddaughter which is stressful to her.She has hoarding behavior including collecting empty used containers.  *** Any worsening depression?:  Denies.   Hallucinations or paranoia?  Denies.  She is very protective of her purse*** Seizures? denies    Any sleep changes?  Denies vivid dreams, REM  behavior or sleepwalking   Sleep apnea?   Denies.   Any hygiene concerns?  Needs reminder. Independent of bathing and dressing?  Endorsed  Does the patient needs help with medications?  Patient is in charge, denies forgetting any doses*** Who is in charge of the finances?  There are is in charge   *** Any changes in appetite?  denies ***   Patient have trouble swallowing? Denies.   Does the patient cook?  She may do cooking spoiled food, and leave the stove on as it is** Any headaches?   denies   Any vision changes?*** Chronic back pain  denies   Ambulates with difficulty? Denies.  Walks 1-1/2 mile a day in the park*** Recent falls or head injuries?  On 03/05/2024 she was found on the floor with increased confusion, it is unclear if she fell, CT of the head and neck were negative for fractures or any other acute abnormalities.  It appears that this was related to UTI    Unilateral weakness, numbness or tingling? denies   Any tremors?  Denies  *** Any anosmia?  Denies   Any incontinence of urine?  Endorsed recent symptomatic UTI, with increased confusion on August 2025, treated with 6 triaxial hydration***  Any bowel dysfunction?   Denies      Patient lives   *** Does the patient drive?  Yes, daughter is concerned about her driving as she may become lost***  Initial visit 11/28/2023 How long did patient have memory difficulties?  For about 3 years.  Patient reports some  difficulty remembering new information, recent conversations, names.  There is confabulation. She carries a notepad and a pen but daughter is not sure if she writes anything. LTM is affected as well. Disoriented when walking into a room?  Patient denies   Leaving objects in unusual places? Endorsed by her daughter especially the phone and the glasses.   Wandering behavior? Denies.   Any personality changes, or depression, anxiety? She admits depression since her husband died in 11-Apr-2013 he was my everything. She been under  significant amount of stress as she is caring for her daughter who has suffered a stroke in 11-Apr-2022.  She has been demonstrating hoarding behavior, including collecting empty used containers.  She is very confrontational with her daughter and granddaughter. Hallucinations or paranoia? Denies hallucinations, she is very protective of her purse. She could not find it recently, went to every store stating that it had been stolen, even calling the police, but the purse was at home.  Seizures? Denies.    Any sleep changes?  Sleeps well, denies frequent nightmares or dream reenactment, other REM behavior or sleepwalking   Sleep apnea? Denies.   Any hygiene concerns?  Needs reminder. Her daughter states she is not sure how frequently she bathes. Independent of bathing and dressing? Endorsed  Does the patient need help with medications?  Patient is in charge, denies forgetting any doses..  Who is in charge of the finances? Daughter is in charge     Any changes in appetite?   Denies.     Patient have trouble swallowing?  Denies.   Does the patient cook?  Yes, she has been cooking spoiled food, leaving the stove on. One time daughter had to call the fire department.  Any headaches?  Denies.   Chronic pain? Denies.   Ambulates with difficulty? Denies, walks 1.5 miles a day at the park. Recent falls or head injuries?  1 year ago she had a mechanical fall when she was reaching an object an hit her head. Did not LOC, did not get seen at the time.     Vision changes?  Denies any new issues.    Any strokelike symptoms? Denies.   Any tremors? Denies.   Any anosmia? Denies.   Any incontinence of urine? Denies.   Any bowel dysfunction? Denies.      Patient lives with her daughter    History of heavy alcohol intake? Denies.   History of heavy tobacco use? Denies.   Family history of dementia? Mother and sister with Alzheimer's  dementia  Does patient drive? She still drives, got lost 1 time without recurrence.  Her daughter is concerned about her driving. She drives short distances only. Daughter agrees.  Retired from [employer] in 04-11-2005, counts payable.   MRI of the brain, personally reviewed, remarkable for chronic small vessel ischemic changes within the cerebral white matter, mild for age, similar to prior MRI of October 2023.  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  PREVIOUS MEDICATIONS:   CURRENT MEDICATIONS:  Outpatient Encounter Medications as of 04/04/2024  Medication Sig   ALPRAZolam (XANAX) 0.5 MG tablet Take 0.25 mg by mouth every morning.   amLODipine  (NORVASC ) 10 MG tablet Take 10 mg by mouth daily.   spironolactone (ALDACTONE) 25 MG tablet Take 25 mg by mouth daily.   telmisartan (MICARDIS) 80 MG tablet Take 80 mg by mouth daily.   No  facility-administered encounter medications on file as of 04/04/2024.        No data to display            11/28/2023    9:00 AM  Montreal Cognitive Assessment   Visuospatial/ Executive (0/5) 1  Naming (0/3) 2  Attention: Read list of digits (0/2) 0  Attention: Read list of letters (0/1) 0  Attention: Serial 7 subtraction starting at 100 (0/3) 0  Language: Repeat phrase (0/2) 0  Language : Fluency (0/1) 0  Abstraction (0/2) 0  Delayed Recall (0/5) 0  Orientation (0/6) 2  Total 5  Adjusted Score (based on education) 6    Objective:     PHYSICAL EXAMINATION:    VITALS:  There were no vitals filed for this visit.  GEN:  The patient appears stated age and is in NAD. HEENT:  Normocephalic, atraumatic.   Neurological examination:  General: NAD, well-groomed, appears stated age. Orientation: The patient is alert. Oriented to person, place and date Cranial nerves: There is good facial symmetry.The speech is fluent and clear. No aphasia or dysarthria. Fund of  knowledge is appropriate. Recent and remote memory are impaired. Attention and concentration are reduced. Able to name objects and repeat phrases.  Hearing is intact to conversational tone. *** Sensation: Sensation is intact to light touch throughout Motor: Strength is at least antigravity x4. DTR's 2/4 in UE/LE     Movement examination: Tone: There is normal tone in the UE/LE Abnormal movements:  no tremor.  No myoclonus.  No asterixis.   Coordination:  There is no decremation with RAM's. Normal finger to nose  Gait and Station: The patient has no*** difficulty arising out of a deep-seated chair without the use of the hands. The patient's stride length is good.  Gait is cautious and narrow.    Thank you for allowing us  the opportunity to participate in the care of this nice patient. Please do not hesitate to contact us  for any questions or concerns.   Total time spent on today's visit was *** minutes dedicated to this patient today, preparing to see patient, examining the patient, ordering tests and/or medications and counseling the patient, documenting clinical information in the EHR or other health record, independently interpreting results and communicating results to the patient/family, discussing treatment and goals, answering patient's questions and coordinating care.  Cc:  Claudene Pellet, MD  Camie Sevin 04/04/2024 6:23 AM

## 2024-04-09 DIAGNOSIS — N1831 Chronic kidney disease, stage 3a: Secondary | ICD-10-CM | POA: Diagnosis not present

## 2024-04-09 DIAGNOSIS — E669 Obesity, unspecified: Secondary | ICD-10-CM | POA: Diagnosis not present

## 2024-04-09 DIAGNOSIS — E785 Hyperlipidemia, unspecified: Secondary | ICD-10-CM | POA: Diagnosis not present

## 2024-04-09 DIAGNOSIS — N1832 Chronic kidney disease, stage 3b: Secondary | ICD-10-CM | POA: Diagnosis not present

## 2024-04-29 NOTE — Progress Notes (Incomplete)
 Assessment/Plan:     Dementia likely due to Alzheimer's disease  Monique Snyder is a delightful 83 y.o. RH female with a history ofhypertension, hyperlipidemia, CKD, depression, anxiety, bradycardia and a history of dementia likely due to Alzheimer's disease with behavioral disturbance seen today in follow up for memory loss. Patient is currently on memantine  10 mg twice daily***.  Patient is able to participate on ADLs***and to drive without significant difficulties.  Mood is***    Follow up in   months. Continue memantine  10 mg twice daily, side effects discussed*** Recommend good control of her cardiovascular risk factors.  Follow-up with cardiology, she has an appointment for bradycardia as her heart rate remains in the 50s Continue to control mood as per PCP Recommend no further driving      Subjective:    This patient is accompanied in the office by *** who supplements the history.  Previous records as well as any outside records available were reviewed prior to todays visit. Patient was last seen on 12/14/2023 via video visit, last MoCA on 11/28/2023 was 7/30***   Any changes in memory since last visit?   Short-term memory is worse than long-term memory although both of them are affected.   repeats oneself?  Endorsed Disoriented when walking into a room? Denies ***  Leaving objects?  She loses her phone and her glasses, but not in unusual places***  Wandering behavior?  denies   Any personality changes since last visit?  She is depressed since the death of her husband in 05-23-13, caring for her daughter who had a stroke in May 23, 2022.  Continues to be confrontations with patient, daughter her granddaughter which is stressful to her.She has hoarding behavior including collecting empty used containers.  *** Any worsening depression?:  Denies.   Hallucinations or paranoia?  Denies.  She is very protective of her purse*** Seizures? denies    Any sleep changes?  Denies vivid dreams, REM  behavior or sleepwalking   Sleep apnea?   Denies.   Any hygiene concerns?  Needs reminder. Independent of bathing and dressing?  Endorsed  Does the patient needs help with medications?  Patient is in charge, denies forgetting any doses*** Who is in charge of the finances?  There are is in charge   *** Any changes in appetite?  denies ***   Patient have trouble swallowing? Denies.   Does the patient cook?  She may do cooking spoiled food, and leave the stove on as it is** Any headaches?   denies   Any vision changes?*** Chronic back pain  denies   Ambulates with difficulty? Denies.  Walks 1-1/2 mile a day in the park*** Recent falls or head injuries?  On 03/05/2024 she was found on the floor with increased confusion, it is unclear if she fell, CT of the head and neck were negative for fractures or any other acute abnormalities.  It appears that this was related to UTI    Unilateral weakness, numbness or tingling? denies   Any tremors?  Denies  *** Any anosmia?  Denies   Any incontinence of urine?  Endorsed recent symptomatic UTI, with increased confusion on August 2025 ***  Any bowel dysfunction?   Denies      Patient lives   *** Does the patient drive?  Yes, daughter is concerned about her driving as she may become lost***  Initial visit 11/28/2023 How long did patient have memory difficulties?  For about 3 years.  Patient reports some difficulty remembering new information,  recent conversations, names.  There is confabulation. She carries a notepad and a pen but daughter is not sure if she writes anything. LTM is affected as well. Disoriented when walking into a room?  Patient denies   Leaving objects in unusual places? Endorsed by her daughter especially the phone and the glasses.   Wandering behavior? Denies.   Any personality changes, or depression, anxiety? She admits depression since her husband died in 2013-05-15 he was my everything. She been under significant amount of stress as she is  caring for her daughter who has suffered a stroke in 05/15/2022.  She has been demonstrating hoarding behavior, including collecting empty used containers.  She is very confrontational with her daughter and granddaughter. Hallucinations or paranoia? Denies hallucinations, she is very protective of her purse. She could not find it recently, went to every store stating that it had been stolen, even calling the police, but the purse was at home.  Seizures? Denies.    Any sleep changes?  Sleeps well, denies frequent nightmares or dream reenactment, other REM behavior or sleepwalking   Sleep apnea? Denies.   Any hygiene concerns?  Needs reminder. Her daughter states she is not sure how frequently she bathes. Independent of bathing and dressing? Endorsed  Does the patient need help with medications?  Patient is in charge, denies forgetting any doses..  Who is in charge of the finances? Daughter is in charge     Any changes in appetite?   Denies.     Patient have trouble swallowing?  Denies.   Does the patient cook?  Yes, she has been cooking spoiled food, leaving the stove on. One time daughter had to call the fire department.  Any headaches?  Denies.   Chronic pain? Denies.   Ambulates with difficulty? Denies, walks 1.5 miles a day at the park. Recent falls or head injuries?  1 year ago she had a mechanical fall when she was reaching an object an hit her head. Did not LOC, did not get seen at the time.     Vision changes?  Denies any new issues.    Any strokelike symptoms? Denies.   Any tremors? Denies.   Any anosmia? Denies.   Any incontinence of urine? Denies.   Any bowel dysfunction? Denies.      Patient lives with her daughter    History of heavy alcohol intake? Denies.   History of heavy tobacco use? Denies.   Family history of dementia? Mother and sister with Alzheimer's  dementia  Does patient drive? She still drives, got lost 1 time without recurrence. Her daughter is concerned about her  driving. She drives short distances only. Daughter agrees.  Retired from [employer] in 2005/05/15, counts payable.   MRI of the brain, personally reviewed, remarkable for chronic small vessel ischemic changes within the cerebral white matter, mild for age, similar to prior MRI of 05/15/2022.  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  PREVIOUS MEDICATIONS:   CURRENT MEDICATIONS:  Outpatient Encounter Medications as of 04/30/2024  Medication Sig   ALPRAZolam (XANAX) 0.5 MG tablet Take 0.25 mg by mouth every morning.   amLODipine  (NORVASC ) 10 MG tablet Take 10 mg by mouth daily.   spironolactone (ALDACTONE) 25 MG tablet Take 25 mg by mouth daily.   telmisartan (MICARDIS) 80 MG tablet Take 80 mg by mouth daily.   No facility-administered encounter medications on  file as of 04/30/2024.        No data to display            11/28/2023    9:00 AM  Montreal Cognitive Assessment   Visuospatial/ Executive (0/5) 1  Naming (0/3) 2  Attention: Read list of digits (0/2) 0  Attention: Read list of letters (0/1) 0  Attention: Serial 7 subtraction starting at 100 (0/3) 0  Language: Repeat phrase (0/2) 0  Language : Fluency (0/1) 0  Abstraction (0/2) 0  Delayed Recall (0/5) 0  Orientation (0/6) 2  Total 5  Adjusted Score (based on education) 6    Objective:     PHYSICAL EXAMINATION:    VITALS:  There were no vitals filed for this visit.  GEN:  The patient appears stated age and is in NAD. HEENT:  Normocephalic, atraumatic.   Neurological examination:  General: NAD, well-groomed, appears stated age. Orientation: The patient is alert. Oriented to person, place and date Cranial nerves: There is good facial symmetry.The speech is fluent and clear. No aphasia or dysarthria. Fund of knowledge is appropriate. Recent and  remote memory are impaired. Attention and concentration are reduced. Able to name objects and repeat phrases.  Hearing is intact to conversational tone. *** Sensation: Sensation is intact to light touch throughout Motor: Strength is at least antigravity x4. DTR's 2/4 in UE/LE     Movement examination: Tone: There is normal tone in the UE/LE Abnormal movements:  no tremor.  No myoclonus.  No asterixis.   Coordination:  There is no decremation with RAM's. Normal finger to nose  Gait and Station: The patient has no*** difficulty arising out of a deep-seated chair without the use of the hands. The patient's stride length is good.  Gait is cautious and narrow.    Thank you for allowing us  the opportunity to participate in the care of this nice patient. Please do not hesitate to contact us  for any questions or concerns.   Total time spent on today's visit was *** minutes dedicated to this patient today, preparing to see patient, examining the patient, ordering tests and/or medications and counseling the patient, documenting clinical information in the EHR or other health record, independently interpreting results and communicating results to the patient/family, discussing treatment and goals, answering patient's questions and coordinating care.  Cc:  Claudene Pellet, MD  Camie Sevin 04/29/2024 7:05 AM

## 2024-04-30 ENCOUNTER — Ambulatory Visit: Admitting: Physician Assistant

## 2024-05-10 DIAGNOSIS — N1832 Chronic kidney disease, stage 3b: Secondary | ICD-10-CM | POA: Diagnosis not present

## 2024-05-10 DIAGNOSIS — E785 Hyperlipidemia, unspecified: Secondary | ICD-10-CM | POA: Diagnosis not present

## 2024-05-10 DIAGNOSIS — N1831 Chronic kidney disease, stage 3a: Secondary | ICD-10-CM | POA: Diagnosis not present

## 2024-05-10 DIAGNOSIS — E669 Obesity, unspecified: Secondary | ICD-10-CM | POA: Diagnosis not present

## 2024-05-23 DIAGNOSIS — R635 Abnormal weight gain: Secondary | ICD-10-CM | POA: Diagnosis not present

## 2024-05-23 DIAGNOSIS — I10 Essential (primary) hypertension: Secondary | ICD-10-CM | POA: Diagnosis not present

## 2024-05-23 DIAGNOSIS — R600 Localized edema: Secondary | ICD-10-CM | POA: Diagnosis not present

## 2024-06-04 ENCOUNTER — Other Ambulatory Visit: Payer: Self-pay | Admitting: Nephrology

## 2024-06-04 DIAGNOSIS — N1832 Chronic kidney disease, stage 3b: Secondary | ICD-10-CM

## 2024-06-09 DIAGNOSIS — N184 Chronic kidney disease, stage 4 (severe): Secondary | ICD-10-CM | POA: Diagnosis not present

## 2024-06-09 DIAGNOSIS — E669 Obesity, unspecified: Secondary | ICD-10-CM | POA: Diagnosis not present

## 2024-06-09 DIAGNOSIS — N1832 Chronic kidney disease, stage 3b: Secondary | ICD-10-CM | POA: Diagnosis not present

## 2024-06-09 DIAGNOSIS — N1831 Chronic kidney disease, stage 3a: Secondary | ICD-10-CM | POA: Diagnosis not present

## 2024-06-09 DIAGNOSIS — I10 Essential (primary) hypertension: Secondary | ICD-10-CM | POA: Diagnosis not present

## 2024-06-09 DIAGNOSIS — E785 Hyperlipidemia, unspecified: Secondary | ICD-10-CM | POA: Diagnosis not present

## 2024-06-13 DIAGNOSIS — I129 Hypertensive chronic kidney disease with stage 1 through stage 4 chronic kidney disease, or unspecified chronic kidney disease: Secondary | ICD-10-CM | POA: Diagnosis not present

## 2024-06-13 DIAGNOSIS — F0390 Unspecified dementia without behavioral disturbance: Secondary | ICD-10-CM | POA: Diagnosis not present

## 2024-06-13 DIAGNOSIS — N1832 Chronic kidney disease, stage 3b: Secondary | ICD-10-CM | POA: Diagnosis not present

## 2024-08-13 NOTE — Progress Notes (Incomplete)
 "   Dementia likely due to    Monique Snyder is a very pleasant 84 y.o. RH female with a history of hypertension, hyperlipidemia, CKD, depression, anxiety, bradycardia seen today in follow up for memory loss. Patient was last seen on 11/28/2023, with MoCA 7/30 indicative of dementia, with concerns for Alzheimer's disease. Memory decline is noted.  She is on memantine  10 mg twice daily (bradycardia).  She needs some assistance with ADLs.  She continues to drive***.  Mood is controlled with medications.*** . This patient is accompanied in the office by her daughter*** who supplements the history.  Previous records as well as any outside records available were reviewed prior to todays visit.   Follow up in  months Continue memantine  10 mg twice daily side effects discussed Recommend good control of cardiovascular risk factors, follow-up with cardiology.   Continue to control mood as per PCP Recommend no further driving***   Discussed the use of AI scribe software for clinical note transcription with the patient, who gave verbal consent to proceed.  History of Present Illness   Initial visit 11/28/2023 How long did patient have memory difficulties?  For about 3 years.  Patient reports some difficulty remembering new information, recent conversations, names.  There is confabulation. She carries a notepad and a pen but daughter is not sure if she writes anything. LTM is affected as well. Disoriented when walking into a room?  Patient denies   Leaving objects in unusual places? Endorsed by her daughter especially the phone and the glasses.   Wandering behavior? Denies.   Any personality changes, or depression, anxiety? She admits depression since her husband died in 09/06/2012 he was my everything. She been under significant amount of stress as she is caring for her daughter who has suffered a stroke in 09-06-2021.  She has been demonstrating hoarding behavior, including collecting empty used containers.  She  is very confrontational with her daughter and granddaughter. Hallucinations or paranoia? Denies hallucinations, she is very protective of her purse. She could not find it recently, went to every store stating that it had been stolen, even calling the police, but the purse was at home.  Seizures? Denies.    Any sleep changes?  Sleeps well, denies frequent nightmares or dream reenactment, other REM behavior or sleepwalking   Sleep apnea? Denies.   Any hygiene concerns?  Needs reminder. Her daughter states she is not sure how frequently she bathes. Independent of bathing and dressing? Endorsed  Does the patient need help with medications?  Patient is in charge, denies forgetting any doses..  Who is in charge of the finances? Daughter is in charge     Any changes in appetite?   Denies.     Patient have trouble swallowing?  Denies.   Does the patient cook?  Yes, she has been cooking spoiled food, leaving the stove on. One time daughter had to call the fire department.  Any headaches?  Denies.   Chronic pain? Denies.   Ambulates with difficulty? Denies, walks 1.5 miles a day at the park. Recent falls or head injuries?  1 year ago she had a mechanical fall when she was reaching an object an hit her head. Did not LOC, did not get seen at the time.     Vision changes?  Denies any new issues.    Any strokelike symptoms? Denies.   Any tremors? Denies.   Any anosmia? Denies.   Any incontinence of urine? Denies.   Any bowel dysfunction? Denies.  Patient lives with her daughter    History of heavy alcohol intake? Denies.   History of heavy tobacco use? Denies.   Family history of dementia? Mother and sister with Alzheimer's  dementia  Does patient drive? She still drives, got lost 1 time without recurrence. Her daughter is concerned about her driving. She drives short distances only. Daughter agrees.  Retired from [employer] in 2006, counts payable.   MRI of the brain, personally  reviewed, remarkable for chronic small vessel ischemic changes within the cerebral white matter, mild for age, similar to prior MRI of October 2023.  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           No data to display            11/28/2023    9:00 AM  Montreal Cognitive Assessment   Visuospatial/ Executive (0/5) 1  Naming (0/3) 2  Attention: Read list of digits (0/2) 0  Attention: Read list of letters (0/1) 0  Attention: Serial 7 subtraction starting at 100 (0/3) 0  Language: Repeat phrase (0/2) 0  Language : Fluency (0/1) 0  Abstraction (0/2) 0  Delayed Recall (0/5) 0  Orientation (0/6) 2  Total 5  Adjusted Score (based on education) 6      Objective:    Neurological Exam:    VITALS:  There were no vitals filed for this visit.  GEN:  The patient appears stated age and is in NAD. HEENT:  Normocephalic, atraumatic.   Neurological examination:  General: NAD, well-groomed, appears stated age. Orientation: The patient is alert. Oriented to person, not to place or time.  Cranial nerves: There is good facial symmetry.The speech is fluent and clear. No aphasia or dysarthria. Fund of knowledge is reduced. Recent and remote memory are impaired. Attention and concentration are reduced. Able to name objects and repeat phrases.  Hearing is intact to conversational tone. *** Sensation: Sensation is intact to light touch throughout Motor: Strength is at least antigravity x4. DTR's 2/4 in UE/LE     Movement examination:  Tone: There is normal tone in the UE/LE Abnormal movements:  no tremor.  No myoclonus.  No asterixis.   Coordination:  There is no decremation with RAM's. Normal finger to nose  Gait and Station: The patient has difficulty arising out of a deep-seated chair without the use of the hands. The patient's  stride length is good, uses a walker to ambulate.  Gait is cautious and narrow.    Thank you for allowing us  the opportunity to participate in the care of this nice patient. Please do not hesitate to contact us  for any questions or concerns.   Total time spent on today's visit was *** minutes dedicated to this patient today, preparing to see patient, examining the patient, ordering tests and/or medications and counseling the patient, documenting clinical information in the EHR or other health record, independently interpreting results and communicating results to the patient/family, discussing treatment and goals, answering patient's questions and coordinating care.  Cc:  Claudene Pellet, MD  Camie Sevin 08/13/2024 8:28 AM      "

## 2024-08-14 ENCOUNTER — Ambulatory Visit: Admitting: Physician Assistant

## 2024-08-14 ENCOUNTER — Encounter: Payer: Self-pay | Admitting: Physician Assistant
